# Patient Record
Sex: Female | Born: 1950 | Race: White | Hispanic: No | Marital: Married | Smoking: Former smoker
Health system: Southern US, Community
[De-identification: ages and names within clinical notes are randomized; demographics above are authoritative.]

## PROBLEM LIST (undated history)

## (undated) DIAGNOSIS — L439 Lichen planus, unspecified: Secondary | ICD-10-CM

## (undated) DIAGNOSIS — I10 Essential (primary) hypertension: Secondary | ICD-10-CM

## (undated) DIAGNOSIS — M1990 Unspecified osteoarthritis, unspecified site: Secondary | ICD-10-CM

## (undated) DIAGNOSIS — I480 Paroxysmal atrial fibrillation: Secondary | ICD-10-CM

## (undated) DIAGNOSIS — I491 Atrial premature depolarization: Secondary | ICD-10-CM

## (undated) DIAGNOSIS — R55 Syncope and collapse: Secondary | ICD-10-CM

## (undated) DIAGNOSIS — D249 Benign neoplasm of unspecified breast: Secondary | ICD-10-CM

## (undated) HISTORY — DX: Lichen planus, unspecified: L43.9

## (undated) HISTORY — PX: ABDOMINAL HYSTERECTOMY: SHX81

## (undated) HISTORY — DX: Unspecified osteoarthritis, unspecified site: M19.90

## (undated) HISTORY — PX: BREAST SURGERY: SHX581

## (undated) HISTORY — PX: REPLACEMENT TOTAL KNEE: SUR1224

## (undated) HISTORY — DX: Atrial premature depolarization: I49.1

## (undated) HISTORY — DX: Paroxysmal atrial fibrillation: I48.0

## (undated) HISTORY — DX: Essential (primary) hypertension: I10

## (undated) HISTORY — DX: Syncope and collapse: R55

## (undated) HISTORY — PX: CHOLECYSTECTOMY: SHX55

## (undated) HISTORY — PX: FOOT FUSION: SHX956

---

## 1998-04-11 ENCOUNTER — Ambulatory Visit (HOSPITAL_COMMUNITY): Admission: RE | Admit: 1998-04-11 | Discharge: 1998-04-11 | Payer: Self-pay | Admitting: Orthopedic Surgery

## 1998-04-11 ENCOUNTER — Encounter: Payer: Self-pay | Admitting: Orthopedic Surgery

## 1998-11-25 ENCOUNTER — Encounter: Payer: Self-pay | Admitting: Cardiology

## 1998-11-25 ENCOUNTER — Inpatient Hospital Stay (HOSPITAL_COMMUNITY): Admission: EM | Admit: 1998-11-25 | Discharge: 1998-11-25 | Payer: Self-pay | Admitting: Emergency Medicine

## 1998-12-12 ENCOUNTER — Ambulatory Visit (HOSPITAL_BASED_OUTPATIENT_CLINIC_OR_DEPARTMENT_OTHER): Admission: RE | Admit: 1998-12-12 | Discharge: 1998-12-12 | Payer: Self-pay | Admitting: Orthopedic Surgery

## 2000-12-02 ENCOUNTER — Emergency Department (HOSPITAL_COMMUNITY): Admission: EM | Admit: 2000-12-02 | Discharge: 2000-12-02 | Payer: Self-pay | Admitting: Internal Medicine

## 2001-04-14 ENCOUNTER — Other Ambulatory Visit: Admission: RE | Admit: 2001-04-14 | Discharge: 2001-04-14 | Payer: Self-pay | Admitting: Gynecology

## 2003-12-17 ENCOUNTER — Other Ambulatory Visit: Admission: RE | Admit: 2003-12-17 | Discharge: 2003-12-17 | Payer: Self-pay | Admitting: Gynecology

## 2005-06-15 ENCOUNTER — Inpatient Hospital Stay (HOSPITAL_COMMUNITY): Admission: RE | Admit: 2005-06-15 | Discharge: 2005-06-19 | Payer: Self-pay | Admitting: Orthopedic Surgery

## 2007-01-04 ENCOUNTER — Ambulatory Visit: Payer: Self-pay | Admitting: Cardiology

## 2007-01-04 ENCOUNTER — Inpatient Hospital Stay (HOSPITAL_COMMUNITY): Admission: AD | Admit: 2007-01-04 | Discharge: 2007-01-05 | Payer: Self-pay | Admitting: Cardiology

## 2007-01-05 ENCOUNTER — Ambulatory Visit: Payer: Self-pay | Admitting: Cardiology

## 2007-01-07 ENCOUNTER — Ambulatory Visit: Payer: Self-pay | Admitting: Internal Medicine

## 2007-01-07 LAB — CONVERTED CEMR LAB
ALT: 167 units/L — ABNORMAL HIGH (ref 0–40)
Albumin: 3.5 g/dL (ref 3.5–5.2)
Alkaline Phosphatase: 143 units/L — ABNORMAL HIGH (ref 39–117)
Bilirubin, Direct: 0.1 mg/dL (ref 0.0–0.3)
Total Bilirubin: 0.6 mg/dL (ref 0.3–1.2)
Total Protein: 6.5 g/dL (ref 6.0–8.3)

## 2007-01-12 ENCOUNTER — Ambulatory Visit: Payer: Self-pay | Admitting: Internal Medicine

## 2007-02-01 ENCOUNTER — Ambulatory Visit: Payer: Self-pay | Admitting: Internal Medicine

## 2007-02-01 LAB — CONVERTED CEMR LAB
ALT: 15 U/L
AST: 19 U/L
Albumin: 3.5 g/dL
Alkaline Phosphatase: 65 U/L
Bilirubin, Direct: 0.1 mg/dL
Total Bilirubin: 0.7 mg/dL
Total Protein: 6.3 g/dL

## 2008-01-25 ENCOUNTER — Encounter: Admission: RE | Admit: 2008-01-25 | Discharge: 2008-01-25 | Payer: Self-pay | Admitting: Orthopedic Surgery

## 2008-05-21 ENCOUNTER — Ambulatory Visit (HOSPITAL_COMMUNITY): Admission: RE | Admit: 2008-05-21 | Discharge: 2008-05-21 | Payer: Self-pay | Admitting: Orthopedic Surgery

## 2008-06-25 ENCOUNTER — Inpatient Hospital Stay (HOSPITAL_COMMUNITY): Admission: RE | Admit: 2008-06-25 | Discharge: 2008-06-27 | Payer: Self-pay | Admitting: Orthopedic Surgery

## 2008-06-25 ENCOUNTER — Encounter (INDEPENDENT_AMBULATORY_CARE_PROVIDER_SITE_OTHER): Payer: Self-pay | Admitting: Orthopedic Surgery

## 2009-04-03 ENCOUNTER — Encounter: Admission: RE | Admit: 2009-04-03 | Discharge: 2009-04-03 | Payer: Self-pay | Admitting: Orthopedic Surgery

## 2010-07-01 ENCOUNTER — Encounter: Admission: RE | Admit: 2010-07-01 | Discharge: 2010-07-01 | Payer: Self-pay | Admitting: Orthopedic Surgery

## 2010-11-05 ENCOUNTER — Other Ambulatory Visit: Payer: Self-pay | Admitting: Dermatology

## 2010-12-16 NOTE — Consult Note (Signed)
Denise Ayala, SUSI NO.:  [id number]   MEDICAL RECORD NO.:  [id number]          PATIENT TYPE:  INP   LOCATION:  4737                         FACILITY:  [facility]   PHYSICIAN:  Iva Boop, MD,FACGDATE OF BIRTH:  1950/08/08   DATE OF CONSULTATION:  DATE OF DISCHARGE:                                 CONSULTATION   REASON FOR CONSULTATION:  Abnormal LFTs.   ASSESSMENT:  A 60 year old white woman who developed diarrhea and  diffuse aches and pains of abdominal pain prior to admission.  The  diarrhea resolved but she still fell unwell and had some chest pain  issues.  She has ruled out for MI.  Stress test is negative.  She has  elevated transaminases and alkaline phosphatase with AST 543-261, ALT  334-349, alk phos 164-172 and a total bilirubin normal.  She feels  better at this point, though she still is sort of sore all over.  Her  CPKs were normal.   I think she had some sort of viral gastroenteritis or other type of  infectious problem that seems to be resolving.  Serious cardiac problems  have been ruled out.  Gallbladder ultrasound (she is status post chole)  is unrevealing.   PLAN AND RECOMMENDATIONS:  Discharge to home.  I discussed this with Dr.  Riley Kill.  We plan to follow up LFTs in outpatient, and I will arrange  that.  If they fail to improve, then we will investigate further, but I  am expecting that this will resolve over time.   HISTORY:  A 60 year old with problems as outlined above.  She does have  a prior history of abnormal LFTs with Daypro.  She takes Celebrex,  Toprol XL, aspirin, Elavil and Premarin chronically, but has not had any  abnormal liver tests lately that she is aware of.  She did have some  beers over the weekend prior to the starting, then felt sort of achy and  unwell, a little bit nauseous and had a lot of diarrhea with no  bleeding.  She did not describe fevers that I am aware of.  She had some  epigastric burning and  chest pain.  Saw Dr. Riley Kill.  Because of that,  she was admitted to the hospital and underwent stress testing which was  negative.  EKG negative.  Rule out for MI.   PAST MEDICAL HISTORY:  1. Leg cancer.  2. Atrial fibrillation.  3. Transient hypertension.  4. Right knee surgery.  5. Right breast biopsy.  6. Cholecystectomy 1972 (these symptoms are not like that).   DRUG ALLERGIES:  CODEINE, VICODIN/HYDROCORTISONE, OXYCODONE   REVIEW OF SYSTEMS:  She had some knee pain and had a cortisone shot 2  weeks ago.  She had an ocular virus lately or at least a presumptive  diagnosis of blurry vision that is almost completely cleared.   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  Rare  alcohol, overall former smoker.   FAMILY HISTORY:  Positive for coronary artery disease, emphysema,  multiple sclerosis and COPD.   REVIEW  OF SYSTEMS:  Knee pain.  No chills.  No fever.  Contact lenses  are used.  Blurry vision has been resolved.  There has been no weight  loss.  All other systems appear negative except for those things  described above.  She did have some Cipro for urinary tract infection  recently.  She never took Cipro and Flagyl as prescribed for the  diarrheal illness, it resolved on its.   PHYSICAL EXAMINATION:  GENERAL:  Well-developed, well-nourished woman in  no acute distress.  VITAL SIGNS:  Temperature 99.7, pulse 64, respirations 18, blood  pressure 102/63.  HEENT:  EYES:  Anicteric.  Mouth free of lesions.  NECK:  Supple.  No thyromegaly or masses.  CHEST:  Clear.  HEART:  S1, S2.  No murmurs or gallops.  ABDOMEN:  Soft, nontender without organomegaly or masses.  It is mildly  tender diffusely, but not serious tenderness.  LOWER EXTREMITIES:  Free of edema.  There are surgical scars in the  lower extremities.  She has a hyperpigmented violaceous area in the  pretibial area on the right.  This is chronic, mildly tender, slightly  swollen there.    LABORATORY DATA:  Labs as described above.  B-met normal.  Coags normal.  CBC normal.  Hepatitis A IgM, antibody B surface antigen, core antibody  IgM and C virus antibody is negative.  EKG normal.  Stress test as  above.  PA and lateral chest x-ray normal.  Ultrasound of the abdomen  normal status post cholecystectomy.   I appreciate the opportunity to care for this patient.      Iva Boop, MD,FACG  Electronically Signed     CEG/MEDQ  D:  01/05/2007  T:  01/06/2007  Job:  [id number]   cc:   Arturo Morton. Riley Kill, MD, [facility]  Gloriajean Dell. Andrey Campanile, M.D.

## 2010-12-16 NOTE — Op Note (Signed)
Denise Ayala, Denise Ayala                 ACCOUNT NO.:  [id number]   MEDICAL RECORD NO.:  [id number]          PATIENT TYPE:  INP   LOCATION:  5019                         FACILITY:  [facility]   PHYSICIAN:  Mila Homer. Sherlean Foot, M.D. DATE OF BIRTH:  1951-01-12   DATE OF PROCEDURE:  06/25/2008  DATE OF DISCHARGE:                               OPERATIVE REPORT   SURGEON:  Mila Homer. Sherlean Foot, MD   ASSISTANT:  1. Altamese Cabal, PA-C  2. Skip Mayer, PA   PREOPERATIVE DIAGNOSIS:  Failed right total knee arthroplasty.   POSTOPERATIVE DIAGNOSIS:  Failed right total knee arthroplasty.   PROCEDURE:  Right total knee arthroplasty.   INDICATIONS FOR PROCEDURE:  The patient is a 60 year old white female  with failure of a primary knee replacement.  Preoperative range of  motion 0-45 degrees.  Informed consent obtained.   DESCRIPTION OF PROCEDURE:  The patient was laid supine and administered  general anesthesia.  Foley catheter was placed.  Preoperative range of  motion 0-45 under anesthesia.  I then sterilely prepped and draped the  extremity and exsanguinated with an Esmarch and inflated the tourniquet  to 350 mmHg.  I then made a midline incision.  I extended the prior  excision of at least 4 cm.  I used the new blade to make a median  parapatellar arthrotomy and performed synovectomy.  There was lots of  scar tissue in knee and I debrided all sharply.  Once I had cleaned out  the medial and lateral gutters, I was able to evaluate the patellar  component which looked actually quite good and subluxed the patella  laterally around the flexion at this point with the patella tracked and  I could get to 90 degrees and with a sublux, could get to about 105  degrees.  I then evaluated the components.  It did appear that the  anterior component was mildly loose, so that I could definitely see  fluid coming out when I would stress the knee in a varus valgus  direction from under the anterior wedge.  The small  sagittal saw was  used to create an interface between the component and the cement.  I  then removed the femoral and tibial components quite easily.  I then  removed all excess cement and soft tissue.  I removed scar tissue from  the back of the knee.  Posterior condylar osteophytes were removed as  well.  I then used intramedullary reamers with 14 on the femur, 11 on  the tibia.  I sized to size D on the femur and had to take a little bit  of bone anteriorly and posteriorly, with that could see the facet, cup  with the box and had excellent fit of the D component with a 14 offset  stem bringing the component a little bit more posterior.  I then put a  Hohmann retractor in place, hyperflexed the knee at this point to get to  about 105 degrees and used a 3 baseplate drilled and keeled and put a 10-  mm stem on and then trialed 3  tibia, 10 stem, 14 offset stem on the  femur with a size D.  No augments, various polyethylenes and felt a 14  gave [place] best flexion/extension gap balance after releasing a little  medially to get balance.  Patella tracked beautifully.  At this point, I  removed the trial components and copiously irrigated.  I then cemented  in the components with Palamed G cement and removed all excess cement  and allowed the cement to hard in extension.  I then let tourniquet  down, obtained hemostasis, and copiously irrigated once again.  A left  Hemovac coming out superolaterally and deep to the arthrotomy. Pain  catheter coming out superomedially and superficially to the arthrotomy.  I then closed the arthrotomy with figure-of-eight #1 Vicryl sutures,  deep soft tissue with buried with 0 Vicryl sutures, subcuticular 2-0  Vicryl stitch and skin staples.  Dressed with Xeroform dressing,  sponges, sterile Webril, and TED stocking.   COMPLICATIONS:  None.   DRAINS:  One Hemovac and one pain catheter.   TOURNIQUET TIME:  1 hour 34 minutes.            ______________________________  Mila Homer Sherlean Foot, M.D.     SDL/MEDQ  D:  06/25/2008  T:  06/26/2008  Job:  [id number]

## 2010-12-16 NOTE — H&P (Signed)
[name], [name]                 ACCOUNT NO.:  [id number]   MEDICAL RECORD NO.:  [id number]          PATIENT TYPE:  INP   LOCATION:  4737                         FACILITY:  [facility]   PHYSICIAN:  Arturo Morton. Riley Kill, MD, FACCDATE OF BIRTH:  Mar 09, 1951   DATE OF ADMISSION:  01/04/2007  DATE OF DISCHARGE:                              HISTORY & PHYSICAL   CHIEF COMPLAINT:  Chest pain since Sunday night.   HISTORY OF PRESENT ILLNESS:  [name] is a 60 year old female who  I have seen previously.  She had chest pain in 1998 and underwent  cardiac catheterization by Dr. Syliva Overman demonstrating systemic  hypertension, but no epicardial coronary artery disease.  Overall LV  function was preserved.  She underwent stress Cardiolite imaging.  This  was done in 2004.  At that time, there was a hypertensive response to  exercise but no electrocardiographic normal myocardial perfusion image  abnormalities associated with ischemia.  She has subsequently done well.  On Friday night, this woman had five beers.  Over the weekend, she has  had diarrhea.  She has been placed on Cipro for this.  The diarrhea has  improved.  She had liver function studies done yesterday in the office,  associated with other laboratory studies.  The liver function studies  demonstrated marked abnormalities.  While she was in the office, she had  EKG which showed nonspecific ST-T abnormalities and she was set up to be  seen today.  Two nights ago, the patient had an episode of substernal  chest pain.  It did not radiate much but then she had an episode of  substernal chest pain radiating into the jaw today.  She has had  recurrent episodes of chest pain that gradually resolved, both awoke her  from sleep.   CURRENT ALLERGIES:  CODEINE.   MEDICATIONS:  1. Toprol XL 100 mg daily.  2. Celebrex 200 mg daily.  3. Premarin 0.65 mg daily.  4. Aspirin 81 mg daily.  5. Elavil 50 mg daily.   PAST MEDICAL HISTORY:  1. Hypertension.  2. She had a leg cancer.  3. She apparently has had atrial fibrillation, although I was unaware      of this.  4. She had a right knee surgeries.  5. Right breast biopsy 10 years ago.  6. She had a cholecystectomy in 1972.   Her last chest x-ray was in November 2006 and revealed no active  cardiopulmonary disease.   FAMILY HISTORY:  Remarkable for mother who died at 53 of an MI.  Father  died at 81 and had emphysema.  Maternal grandmother had MS.  She has  three sisters, one with COPD, one with type 2 diabetes and one who is  healthy.  She has no brothers.   REVIEW OF SYSTEMS:  She has had some decreased vision.  She wears  glasses.  She wears partial dentures.  She has had some nausea and  diarrhea over the past few days.  She has had no known bleeding.  She  had a UTI treated year ago.  Her last Pap was in October 2007 and a  mammogram in August 2007.  She had some cramping of the legs last week.   The patient is an Print production planner for a [employer], product/process development.  She is  married and she likes to bike.  She has two children.   PHYSICAL EXAMINATION:  GENERAL APPEARANCE:  She is alert and oriented in  no acute distress.  VITAL SIGNS:  The blood pressure is 118/68 in the right arm and 122/74  in the left arm.  The pulse was 63.  NECK:  Jugular veins are not distended.  CARDIAC:  Rhythm is regular.  There is no murmur.  ABDOMEN:  Soft.  There is no hepatosplenomegaly noted.  EXTREMITIES:  No edema.  NEUROLOGIC:  Exam is nonfocal.   Electrocardiogram demonstrates normal sinus rhythm within normal limits.   IMPRESSION:  1. Recurrent episodes of substernal chest pain of uncertain etiology.  2. Recent diarrhea, now on Cipro.  3. Marked abnormality of liver function maladies.  4. History of cholecystectomy.  5. History of hypertension.  6. Questionable history of atrial fibrillation.   PLAN:  1. Admit to the hospital.  2. Serial enzymes.  3. Exercise tolerance  test in the morning.  4. GI consult.  5. Recheck liver function abnormalities.      Arturo Morton. Riley Kill, MD, [facility]  Electronically Signed     TDS/MEDQ  D:  01/04/2007  T:  01/04/2007  Job:  [id number]   cc:   Gloriajean Dell. Andrey Campanile, M.D.  Evelena Peat, M.D.

## 2010-12-19 NOTE — Op Note (Signed)
NAMEBONNEE, Denise Ayala NO.:  [id number]   MEDICAL RECORD NO.:  [id number]          PATIENT TYPE:  INP   LOCATION:  5003                         FACILITY:  [facility]   PHYSICIAN:  Loreta Ave, M.D. DATE OF BIRTH:  1950/10/01   DATE OF PROCEDURE:  06/15/2005  DATE OF DISCHARGE:                                 OPERATIVE REPORT   PREOPERATIVE DIAGNOSIS:  Right knee end stage degenerative arthritis,  patellofemoral joint with lesser extent changes lateral compartment.   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  Mild  flexion contracture.   OPERATION PERFORMED:  Right knee total knee replacement, [employer]  prosthesis.  Minimally invasive system.  Cemented posterior stabilized #7  femoral component.  Cemented #5 tibial component with an 8 mm posterior  stabilized polyethylene Flex insert.  Cemented resurfacing medialized #5  patellar component.  Soft tissue balancing including lateral retinacular  release.   SURGEON:  Loreta Ave, M.D.   ASSISTANT:  Genene Churn. Denton Meek.   ANESTHESIA:  General.   ESTIMATED BLOOD LOSS:  Minimal.   TOURNIQUET TIME:  Two hours.   SPECIMENS:  None.   CULTURES:  None.   COMPLICATIONS:  None.   DRESSING:  Soft compressive with knee immobilizer.   DRAINS:  Hemovac times one.   DESCRIPTION OF PROCEDURE:  The patient was brought to the operating room and  after adequate anesthesia had been obtained, the right knee examined.  5  degree flexion contracture, still lateral tracking, little tethering  patellofemoral joint.  Further flexion better than 110 degrees.  Stable  ligaments.  Tourniquet applied.  Leg prepped and draped in the usual sterile  fashion.  Exsanguinated elevation Esmarch. Tourniquet inflated 350 mmHg.  Lontitudinal incision above the patella down to the tibial tubercle.  Medial  arthrotomy with a  minimally invasive approach splitting the vastus and  sparing the quad tendon.  Knee exposed.  Grade 4 changes throughout the  patellofemoral joint as expected.  The medial compartment looked good but  unfortunately the lateral compartment had advanced considerably from her  previous arthroscopy.  Therefore, my initial effort just to replace the  patellofemoral joint was abandoned and I went with a total knee approach  because of the grade 3 and 4 changes laterally.  Sufficient capsular release  to expose the knee.  Periarticular spurs, remnants of menisci, cruciate  ligaments removed.  Attention turned to tibia.  Extramedullary guide 0  cutting device.  6 mm cut off the deficient medial side, protecting  posterior collateral structures.  Very good bone stock. Tibia sized with a  #5 component. Femur exposed.  Intramedullary guide placed.  Distal cut,  appropriate external rotation resecting 10 mm, set at about 5 degrees of  valgus to match her mechanical axis.  Size for a #7 component.  Definitive  cuts made with appropriate jigs for the peg, posterior stabilized #7 femoral  component.  Patella was everted.  Marked erosion whole lateral side so I  felt resurfacing was  better than a recessed patella. Resection back to a 10  mm thickness of the patella throughout.  I sized and then drilled for the  resurfacing #5 component with the medial offset.  When that was completed,  all recesses examined and copious irrigation with the pulse irrigating  device.  Trials put in place.  A #7 on the femur, a #5 on the tibia and #5  on the patella.  With the X3 poly being utilized, I elected to go with an 8  mm insert.  With the 8 mm  trial on tibia and the other inserts in place, I  had full extension, nicely balanced knee, full flexion.  Although I had good  external rotation of the femur, I still had a tendency for the patella to  want to tether out laterally.  Lateral release from the inside with  cautery  improved this so I had good patellofemoral tracking and nice balance of  patellofemoral joint throughout anterolateral release. Tibia was marked for  appropriate rotation and then hand reamed.  All trials removed. Copious  irrigation.  All recesses examined to be sure all spurs and loose bodies  removed.  Cement prepared and placed on components.  Tibial component  hammered into place. Polyethylene attached and the femoral component seated  and firmly cemented in place.  Patellar component also seated and cemented  as well.  All excessive cement removed.  At completion, once the cement  hardened, the knee was re-examined.  Full extension, full flexion, good  stability, nicely balanced knee with good stability in flexion and  extension.  Excellent patellofemoral tracking and stability.  Wound  irrigated.  Hemovac placed and brought out through a separate stab wound.  Arthrotomy closed with #1 Vicryl, subcutaneous and subcuticular tissue with  2-0 Vicryl and then staples.  Knee injected with Marcaine and Hemovac  clamped.  Sterile compressive dressing applied.  Tourniquet inflated and  removed.  Knee immobilizer applied.  Anesthesia reversed.  Brought to  recovery room.  Tolerated surgery well.  No complications.      Loreta Ave, M.D.  Electronically Signed     DFM/MEDQ  D:  06/16/2005  T:  06/17/2005  Job:  [id number]

## 2010-12-19 NOTE — Discharge Summary (Signed)
Denise Ayala, Denise Ayala NO.:  [id number]   MEDICAL RECORD NO.:  [id number]          PATIENT TYPE:  INP   LOCATION:  5003                         FACILITY:  [facility]   PHYSICIAN:  Loreta Ave, M.D. DATE OF BIRTH:  1951/05/28   DATE OF ADMISSION:  06/15/2005  DATE OF DISCHARGE:  06/19/2005                                 DISCHARGE SUMMARY   FINAL DIAGNOSES:  1.  Status post right total knee replacement for end-stage degenerative      joint disease.  2.  Hypertension, NOS  3.  Long term use of anticoagulants.   HISTORY OF PRESENT ILLNESS:  60 year old white female with history of end-  stage degenerative joint disease right knee who presented to our office for  preoperative evaluation for total knee replacement procedure.  She had  progressively worsening pain which failed to respond to conservative  treatment and had significant decrease in her daily activities due to the  ongoing complaint.   HOSPITAL COURSE:  On June 15, 2005 the patient was taken to the [facility] operating room and a right total knee replacement procedure was  performed.  Surgeon was Dr. Loreta Ave and assisted by Fayrene Fearing  __________  PA-C.  Anesthesia was general with femoral nerve block.  EBL was  minimal.  Tourniquet time was two hours.  Hemovac drain X1 placed.  No  specimens.  There were no surgical or anesthesia complications and she was  transferred to the recovery room in stable condition.  On June 16, 2005  the patient was doing well with some complaints of knee pain.  Vital signs  were stable and she was afebrile.  Hemoglobin was 9.5, glucose 137, INR was  1.1.  Dressing was clean, dry and intact.  The calf was nontender,  neurovascularly intact distally.  Physical therapy, occupational therapy  consults.  Started pharmacy protocol Coumadin.  On June 17, 2005 the  patient was doing well with good pain control.  No chest pain, shortness of  breath.  Patient  ambulated to door in the room.  Vital signs were stable and  she was afebrile. Hemoglobin was 9.2, potassium 3.8, sodium 137, INR 1.7,  glucose 117.  Wound looked good.  Staples were intact.  There were no signs  of infection.  Hemovac drain was pulled.  Calf was nontender and  neurovascularly intact.  Discontinued the morphine PCA, Foley catheter and  O2, Hep-Lock IV.  On June 18, 2005 the patient was doing well with good  pain control.  Temperature 100.1, pulse 98, respirations 20, blood pressure  111/70.  Hemoglobin was 9.2, INR 2.2, electrolytes were stable. Wound looks  good, staples intact. No signs of infection.  Calf nontender and  neurovascularly intact.  Discontinued Hep-Lock. On June 19, 2005, the  patient is doing very well.  Good pain and nausea control.  Good hall  ambulation.  Vital signs stable and afebrile. INR is 2.2.  The wound looked  good and staples intact.  No signs of infection.  Calf nontender.  The  patient later completed stair training  and was ready for discharge home.   DISPOSITION:  Patient discharged home.   MEDICATIONS:  1.  Darvocet-N 100 one to two tablets p.o. q.4-6h. PRN for pain.  2.  Robaxin 500 mg one tablet p.o. q.6h. PRN for spasms.  3.  Coumadin per pharmacy protocol X4 weeks postoperatively.  4.  Resume previous home medications.   CONDITION ON DISCHARGE:  Good and stable.   DISCHARGE INSTRUCTIONS:  Patient will work with home health physical therapy  and occupational therapy to improve ambulation, range of motion,  strengthening.  Coumadin X4 weeks postoperatively for deep venous thrombosis  prophylaxis.  Staples are to be removed two weeks postoperatively.  Dressing  changes daily.  She will follow up in our office two weeks postoperatively  for recheck or return sooner if needed.      Loreta Ave, M.D.  Electronically Signed     DFM/MEDQ  D:  08/26/2005  T:  08/26/2005  Job:  [id number]

## 2010-12-19 NOTE — Discharge Summary (Signed)
Denise Ayala, Denise Ayala                 ACCOUNT NO.:  [id number]   MEDICAL RECORD NO.:  [id number]          PATIENT TYPE:  INP   LOCATION:  5019                         FACILITY:  [facility]   PHYSICIAN:  Mila Homer. Sherlean Foot, M.D. 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   DISCHARGE DIAGNOSES:  Painful right knee after total knee arthroplasty,  right total knee revision status post, and acute blood loss anemia,  status post surgery.   PROCEDURE:  Right total knee revision.   HISTORY:  This is a 60 year old patient who had a stiff painful knee  after a right TKA last year.  Infection was ruled out with aspiration  and successful loosening of hardware.  Risks and benefits of revision  were discussed with the patient.  The patient would like to proceed with  surgery, was originally scheduled in October 2009, was postponed twice,  once for open wound and once again by the patient for back pain.   ALLERGIES:  The patient is allergic to CODEINE and ADHESIVE TAPE.   ADMISSION MEDICATIONS:  Upon admission, the patient was taking,  1. Mobic 15 mg daily.  2. Premarin 0.0625 mg daily.  3. Toprol-XL 100 mg daily.  4. Aspirin 81 mg daily.   HOSPITAL COURSE:  This is a 60 year old woman who was admitted on  June 25, 2008, after appropriate laboratory studies were obtained  preoperatively as well as Ancef on call to the operating room.  She was  taken to the OR where she underwent a right total knee revision.  She  tolerated the procedure well and was taken to the PACU in good  condition.  The patient was placed on a Dilaudid PCA pump, full-dose  protocol.  Foley was placed intraoperatively.   Postop day #1, vital signs were stable.  The patient denied chest pain,  shortness of breath, or calf pain.  The patient was started on  Lovenox  30 mg q.12 h. at a.m.  Consults to PT, OT, and care management were  made.  The patient was weightbearing as tolerated.  CPM 0-90 degrees, 6-  8 hours per day, and incentive spirometry teaching was done.   Postop day #2, the patient continued to progress with physical therapy.  Dressing was changed.  Marcaine pump and Hemovac were discontinued and  Foley was discontinued.  PCA was discontinued and IV was hep-locked.  The patient was continued on p.o. meds for pain.  The patient was  discharged after Lovenox teaching.   LABORATORY STUDIES:  Upon admission, the patient's white blood cell  count was 7.2, H and H 12.9/37.6, and platelets of 208.  Sodium was 138,  potassium 4.3, chloride was 103, CO2 was 29, glucose was 86, BUN was 9,  and creatinine was 0.66.  Upon discharge, white blood cell count was  8.8, H and H 9.4/26.6, and platelets were 166.  Sodium was 131,  potassium was 3.8, chloride was  97, CO2 was 30, glucose was 99, BUN was  5, and creatinine was 0.68.   DISCHARGE INSTRUCTIONS:  No restrictions on diet.  Follow the blue  instruction sheet for wound care.  Increase activity slowly.  May use a  cane or walker.  Weightbearing as tolerated.  No lifting or driving for  6 weeks.  Home health was secured.   DISCHARGE MEDICATIONS:  Upon discharge the patient was given  prescription for:  1. Robaxin 500 mg one to two tablets every 6-8 hours as needed for      spasm.  2. Lovenox 40 mg, inject one daily, last dose on July 09, 2008.  3. Zofran 4 mg ODT, place one tablet on tongue every 8 hours as needed      for nausea.  4. Dilaudid 2 mg one to two tablets every 4-6 hours as needed for      pain.   The patient will follow up with Dr. Sherlean Foot on July 10, 2008.  She is  to call for appointment at [phone].  Discharged in improved condition.     ______________________________  Altamese Cabal, PA-C    ______________________________  Mila Homer. Sherlean Foot, M.D.     MJ/MEDQ  D:  08/10/2008  T:  08/11/2008  Job:  [id number]

## 2011-01-19 ENCOUNTER — Other Ambulatory Visit: Payer: Self-pay | Admitting: Dermatology

## 2011-05-04 LAB — URINALYSIS, ROUTINE W REFLEX MICROSCOPIC
Bilirubin Urine: NEGATIVE
Glucose, UA: NEGATIVE
Ketones, ur: NEGATIVE
Protein, ur: NEGATIVE
Specific Gravity, Urine: 1.008

## 2011-05-04 LAB — TYPE AND SCREEN: ABO/RH(D): O NEG

## 2011-05-04 LAB — COMPREHENSIVE METABOLIC PANEL
ALT: 22
AST: 24
Albumin: 4
Alkaline Phosphatase: 77
Calcium: 9
Creatinine, Ser: 0.69
GFR calc Af Amer: >60
Glucose, Bld: 77
Total Bilirubin: 0.5

## 2011-05-04 LAB — DIFFERENTIAL
Basophils Relative: 1
Lymphocytes Relative: 26
Lymphs Abs: 1.6
Neutrophils Relative %: 61

## 2011-05-04 LAB — CBC
HCT: 37.4
MCV: 95.1
Platelets: 221
RDW: 12.6

## 2011-05-04 LAB — PROTIME-INR: Prothrombin Time: 12.8

## 2011-05-04 LAB — ABO/RH: ABO/RH(D): O NEG

## 2011-05-05 LAB — ANAEROBIC CULTURE

## 2011-05-05 LAB — COMPREHENSIVE METABOLIC PANEL
ALT: 22
Albumin: 3.9
Alkaline Phosphatase: 81
BUN: 9
CO2: 29
Chloride: 103
Creatinine, Ser: 0.66
GFR calc Af Amer: >60
Glucose, Bld: 86
Sodium: 138
Total Protein: 6

## 2011-05-05 LAB — APTT: aPTT: 27

## 2011-05-05 LAB — CROSSMATCH

## 2011-05-05 LAB — GRAM STAIN

## 2011-05-05 LAB — DIFFERENTIAL
Basophils Absolute: 0
Basophils Relative: 1
Lymphs Abs: 1.6
Monocytes Absolute: 0.6
Neutro Abs: 4.8
Neutrophils Relative %: 67

## 2011-05-05 LAB — CBC
HCT: 26.6 — ABNORMAL LOW
Hemoglobin: 9.4 — ABNORMAL LOW
MCHC: 34.4
MCHC: 35.2
MCV: 95
MCV: 95.2
MCV: 96.2
Platelets: 208
Platelets: 212
RBC: 2.77 — ABNORMAL LOW
RBC: 3.11 — ABNORMAL LOW
RBC: 3.95
RDW: 12.2
WBC: 7.2
WBC: 8.7
WBC: 8.8

## 2011-05-05 LAB — URINALYSIS, ROUTINE W REFLEX MICROSCOPIC
Nitrite: NEGATIVE
Specific Gravity, Urine: 1.005
pH: 6.5

## 2011-05-05 LAB — BASIC METABOLIC PANEL
BUN: 6
CO2: 30
Chloride: 102
Chloride: 97
Creatinine, Ser: 0.66
Creatinine, Ser: 0.68
GFR calc Af Amer: >60
GFR calc Af Amer: >60
GFR calc non Af Amer: >60
Potassium: 3.8
Potassium: 4.3
Sodium: 131 — ABNORMAL LOW

## 2011-05-05 LAB — URINE CULTURE: Colony Count: NO GROWTH

## 2011-05-05 LAB — WOUND CULTURE

## 2011-05-21 LAB — CARDIAC PANEL(CRET KIN+CKTOT+MB+TROPI)
CK, MB: 1
CK, MB: 1.9
Total CK: 81

## 2011-05-21 LAB — CBC
HCT: 35.5 — ABNORMAL LOW
Hemoglobin: 12.4
MCV: 93.3
Platelets: 201
RBC: 3.8 — ABNORMAL LOW
WBC: 6.8

## 2011-05-21 LAB — LIPID PANEL
HDL: 80
LDL Cholesterol: 149 — ABNORMAL HIGH
Triglycerides: 38
VLDL: 8

## 2011-05-21 LAB — COMPREHENSIVE METABOLIC PANEL
Albumin: 3.5
Alkaline Phosphatase: 172 — ABNORMAL HIGH
BUN: 7
CO2: 30
Chloride: 100
Creatinine, Ser: 0.6
GFR calc non Af Amer: >60
Potassium: 4.1
Total Bilirubin: 0.8

## 2011-05-21 LAB — PROTIME-INR: INR: 1

## 2011-05-21 LAB — APTT: aPTT: 29

## 2011-09-22 ENCOUNTER — Other Ambulatory Visit: Payer: Self-pay | Admitting: Orthopedic Surgery

## 2011-09-22 DIAGNOSIS — M25512 Pain in left shoulder: Secondary | ICD-10-CM

## 2011-09-25 ENCOUNTER — Ambulatory Visit
Admission: RE | Admit: 2011-09-25 | Discharge: 2011-09-25 | Disposition: A | Discharge status: Home / Self Care | Payer: BC Managed Care – PPO | Source: Ambulatory Visit | Attending: Orthopedic Surgery | Admitting: Orthopedic Surgery

## 2011-09-25 DIAGNOSIS — M25512 Pain in left shoulder: Secondary | ICD-10-CM

## 2011-09-26 ENCOUNTER — Other Ambulatory Visit: Payer: Self-pay

## 2013-03-27 ENCOUNTER — Other Ambulatory Visit: Payer: Self-pay | Admitting: Gynecology

## 2014-08-03 DIAGNOSIS — D249 Benign neoplasm of unspecified breast: Secondary | ICD-10-CM

## 2014-08-03 HISTORY — DX: Benign neoplasm of unspecified breast: D24.9

## 2015-01-02 ENCOUNTER — Other Ambulatory Visit: Payer: Self-pay | Admitting: Obstetrics & Gynecology

## 2015-01-02 DIAGNOSIS — N6452 Nipple discharge: Secondary | ICD-10-CM

## 2015-01-09 ENCOUNTER — Ambulatory Visit
Admission: RE | Admit: 2015-01-09 | Discharge: 2015-01-09 | Disposition: A | Discharge status: Home / Self Care | Payer: BLUE CROSS/BLUE SHIELD | Source: Ambulatory Visit | Attending: Obstetrics & Gynecology | Admitting: Obstetrics & Gynecology

## 2015-01-09 ENCOUNTER — Other Ambulatory Visit: Payer: Self-pay | Admitting: Obstetrics & Gynecology

## 2015-01-09 DIAGNOSIS — N6452 Nipple discharge: Secondary | ICD-10-CM

## 2015-05-31 ENCOUNTER — Other Ambulatory Visit: Payer: Self-pay | Admitting: General Surgery

## 2015-06-17 ENCOUNTER — Encounter (HOSPITAL_BASED_OUTPATIENT_CLINIC_OR_DEPARTMENT_OTHER): Payer: Self-pay | Admitting: *Deleted

## 2015-06-17 NOTE — Progress Notes (Signed)
[place] at Dr Marlowe Aschoff called med back and stated that Dr Barry Dienes had just spoken to Dr Smith Robert in anesthesia and he said to bring patient in for labs and EKG and go from there.

## 2015-06-17 NOTE — Progress Notes (Signed)
[facility] reviewed with Dr Al Corpus. He requests that the pt see Cardiology prior to Surgery since last seen in 2008 with questionable diagnosis of AFib. Office notified and pt called.

## 2015-06-18 ENCOUNTER — Other Ambulatory Visit: Payer: Self-pay

## 2015-06-18 ENCOUNTER — Encounter (HOSPITAL_BASED_OUTPATIENT_CLINIC_OR_DEPARTMENT_OTHER)
Admission: RE | Admit: 2015-06-18 | Discharge: 2015-06-18 | Disposition: A | Discharge status: Home / Self Care | Payer: BLUE CROSS/BLUE SHIELD | Source: Ambulatory Visit | Attending: General Surgery | Admitting: General Surgery

## 2015-06-18 DIAGNOSIS — Z85828 Personal history of other malignant neoplasm of skin: Secondary | ICD-10-CM | POA: Diagnosis not present

## 2015-06-18 DIAGNOSIS — D242 Benign neoplasm of left breast: Secondary | ICD-10-CM | POA: Diagnosis present

## 2015-06-18 DIAGNOSIS — M1990 Unspecified osteoarthritis, unspecified site: Secondary | ICD-10-CM | POA: Diagnosis not present

## 2015-06-18 DIAGNOSIS — Z87891 Personal history of nicotine dependence: Secondary | ICD-10-CM | POA: Diagnosis not present

## 2015-06-18 LAB — BASIC METABOLIC PANEL
ANION GAP: 4 — AB (ref 5–15)
BUN: 10 mg/dL (ref 6–20)
CHLORIDE: 104 mmol/L (ref 101–111)
CO2: 30 mmol/L (ref 22–32)
Calcium: 9.1 mg/dL (ref 8.9–10.3)
Creatinine, Ser: 0.72 mg/dL (ref 0.44–1.00)
GFR calc non Af Amer: >60 mL/min (ref >60)
Glucose, Bld: 96 mg/dL (ref 65–99)
Potassium: 4.7 mmol/L (ref 3.5–5.1)
Sodium: 138 mmol/L (ref 135–145)

## 2015-06-20 NOTE — H&P (Signed)
Denise Ayala 05/31/2015 9:33 AM Location: [facility] Patient #: [id number] DOB: 09-09-1950 Married / Language: Cleophus Molt / Race: White Female   History of Present Illness [name] MD; 05/31/2015 10:06 AM) Patient words: bleeding from left nipple.  The patient is a 64 year old female who presents with a complaint of nipple discharge. Prior history [name] is a 64 year old female who is referred by Dr. Brigitte Pulse for consultation regarding an abnormal left nipple. She has had some redness and scaling of the nipple for several months. She also did have some nipple discharge that resolved. She underwent diagnostic mammography with a fine probably benign finding in the posterior lateral left breast. Scaling and erythema of the nipple did not have any mammographic correlate. She has no family history of cancers. She has had a personal history of a squamous cell cancer of the skin. She states the nipple has felt a little more irritated and slightly painful. She has had a excisional breast biopsy of the right breast in the past by Dr. Margot Chimes. She has also had multiple cysts aspirated. We did a punch biopsy of her nipple which demonstrated a nipple adenoma. ]  The patient has continued to have nonhealing of the nipple. She also continues to have itching and bleeding.   Allergies Elbert Ewings, CMA; 05/31/2015 9:34 AM) Codeine Sulfate *ANALGESICS - OPIOID* Antibacterial Lotion Soap *Antiseptics & Disinfectants  Medication History Elbert Ewings, CMA; 05/31/2015 9:34 AM) Toprol XL (100MG  Tablet ER 24HR, Oral) Active. Voltaren (25MG  Tablet DR, Oral) Active. Estradiol (1MG  Tablet, Oral) Active. Medications Reconciled    Review of Systems [name] MD; 05/31/2015 10:06 AM) All other systems negative  Vitals Elbert Ewings CMA; 05/31/2015 9:35 AM) 05/31/2015 9:34 AM Weight: 168 lb Height: 64in Body Surface Area: 1.82 m Body Mass Index: 28.84 kg/m  Temp.:  58F(Temporal)  Pulse: 77 (Regular)  BP: 126/68 (Sitting, Left Arm, Standard)       Physical Exam [name] MD; 05/31/2015 10:07 AM) General Mental Status-Alert. General Appearance-Consistent with stated age. Hydration-Well hydrated. Voice-Normal.  Breast Note: Left nipple does continue to have the punch biopsy incision opened. The bulk of the nipple laterally is much more erythematous than the other nipple. I suspect that this is all adenoma. That portion was also protuberant. The medial aspect of the nipple does appear to be more normal. Approximately 75% of the nipple appears abnormal.     Assessment & Plan [name] MD; 05/31/2015 10:08 AM) ADENOMA OF NIPPLE, LEFT (D24.2) Impression: Because the patient continues to have significant difficulty healing and problems with the left nipple, I would plan to excise it. There is an adenoma present, I suspect this is the reason for the nonhealing portion.  I discussed the surgery with the patient including the risks. I reviewed that I would leave the areola behind. I reviewed the risks of numbness, bleeding, infection, and other. She would like to do this as soon as possible. I discussed this as an outpatient surgery with approximately a 1 week window of her strictures. 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 Current Plans You are being scheduled for surgery - Our schedulers will call you.  You should hear from our office's scheduling department within 5 working days about the location, date, and time of surgery. We try to make accommodations for patient's preferences in scheduling surgery, but sometimes the OR schedule or the surgeon's schedule prevents [place] from making those accommodations.  If you have not heard from our office [phone]) in 5 working days, call the office and ask for your surgeon's nurse.  If you have other questions about your diagnosis, plan,  or surgery, call the office and ask for your surgeon's nurse.  Pt Education - [facility] Free Text Education/Instructions: discussed with patient and provided information.   Signed by [name], MD (05/31/2015 10:09 AM)

## 2015-06-24 ENCOUNTER — Ambulatory Visit (HOSPITAL_BASED_OUTPATIENT_CLINIC_OR_DEPARTMENT_OTHER): Payer: BLUE CROSS/BLUE SHIELD | Admitting: Anesthesiology

## 2015-06-24 ENCOUNTER — Encounter (HOSPITAL_BASED_OUTPATIENT_CLINIC_OR_DEPARTMENT_OTHER): Payer: Self-pay | Admitting: *Deleted

## 2015-06-24 ENCOUNTER — Ambulatory Visit (HOSPITAL_BASED_OUTPATIENT_CLINIC_OR_DEPARTMENT_OTHER)
Admission: RE | Admit: 2015-06-24 | Discharge: 2015-06-24 | Disposition: A | Discharge status: Home / Self Care | Payer: BLUE CROSS/BLUE SHIELD | Source: Ambulatory Visit | Attending: General Surgery | Admitting: General Surgery

## 2015-06-24 ENCOUNTER — Encounter (HOSPITAL_BASED_OUTPATIENT_CLINIC_OR_DEPARTMENT_OTHER)
Admission: RE | Disposition: A | Discharge status: Home / Self Care | Payer: Self-pay | Source: Ambulatory Visit | Attending: General Surgery

## 2015-06-24 DIAGNOSIS — M1990 Unspecified osteoarthritis, unspecified site: Secondary | ICD-10-CM | POA: Insufficient documentation

## 2015-06-24 DIAGNOSIS — Z85828 Personal history of other malignant neoplasm of skin: Secondary | ICD-10-CM | POA: Insufficient documentation

## 2015-06-24 DIAGNOSIS — D242 Benign neoplasm of left breast: Secondary | ICD-10-CM | POA: Diagnosis not present

## 2015-06-24 DIAGNOSIS — Z87891 Personal history of nicotine dependence: Secondary | ICD-10-CM | POA: Insufficient documentation

## 2015-06-24 HISTORY — DX: Benign neoplasm of unspecified breast: D24.9

## 2015-06-24 HISTORY — DX: Unspecified osteoarthritis, unspecified site: M19.90

## 2015-06-24 HISTORY — PX: BREAST BIOPSY: SHX20

## 2015-06-24 SURGERY — BREAST BIOPSY
Anesthesia: General | Site: Breast | Laterality: Left

## 2015-06-24 MED ORDER — PROMETHAZINE HCL 25 MG/ML IJ SOLN: 6.2500 mg | INTRAMUSCULAR | Status: DC | PRN

## 2015-06-24 MED ORDER — FENTANYL CITRATE (PF) 100 MCG/2ML IJ SOLN
INTRAMUSCULAR | Status: AC
  Filled 2015-06-24: qty 2

## 2015-06-24 MED ORDER — FENTANYL CITRATE (PF) 100 MCG/2ML IJ SOLN: 50.0000 ug | INTRAMUSCULAR | Status: DC | PRN

## 2015-06-24 MED ORDER — PROPOFOL 500 MG/50ML IV EMUL
INTRAVENOUS | Status: AC
  Filled 2015-06-24: qty 50

## 2015-06-24 MED ORDER — SUCCINYLCHOLINE CHLORIDE 20 MG/ML IJ SOLN
INTRAMUSCULAR | Status: AC
  Filled 2015-06-24: qty 1

## 2015-06-24 MED ORDER — CEFAZOLIN SODIUM-DEXTROSE 2-3 GM-% IV SOLR
2.0000 g | INTRAVENOUS | Status: AC
  Administered 2015-06-24: 2 g via INTRAVENOUS

## 2015-06-24 MED ORDER — MIDAZOLAM HCL 5 MG/5ML IJ SOLN
INTRAMUSCULAR | Status: DC | PRN
  Administered 2015-06-24: 2 mg via INTRAVENOUS

## 2015-06-24 MED ORDER — GLYCOPYRROLATE 0.2 MG/ML IJ SOLN
0.2000 mg | Freq: Once | INTRAMUSCULAR | Status: AC | PRN
  Administered 2015-06-24: 0.2 mg via INTRAVENOUS

## 2015-06-24 MED ORDER — ONDANSETRON HCL 4 MG/2ML IJ SOLN
INTRAMUSCULAR | Status: AC
  Filled 2015-06-24: qty 2

## 2015-06-24 MED ORDER — LIDOCAINE-EPINEPHRINE (PF) 1 %-1:200000 IJ SOLN
INTRAMUSCULAR | Status: DC | PRN
  Administered 2015-06-24: 2.5 mL

## 2015-06-24 MED ORDER — PROPOFOL 10 MG/ML IV BOLUS
INTRAVENOUS | Status: DC | PRN
  Administered 2015-06-24: 150 mg via INTRAVENOUS
  Administered 2015-06-24: 50 mg via INTRAVENOUS

## 2015-06-24 MED ORDER — MIDAZOLAM HCL 2 MG/2ML IJ SOLN
INTRAMUSCULAR | Status: AC
  Filled 2015-06-24: qty 2

## 2015-06-24 MED ORDER — LIDOCAINE HCL (CARDIAC) 20 MG/ML IV SOLN
INTRAVENOUS | Status: AC
  Filled 2015-06-24: qty 5

## 2015-06-24 MED ORDER — HYDROCODONE-ACETAMINOPHEN 5-325 MG PO TABS: 1.0000 | ORAL_TABLET | ORAL | Status: DC | PRN

## 2015-06-24 MED ORDER — LIDOCAINE-EPINEPHRINE (PF) 1 %-1:200000 IJ SOLN
INTRAMUSCULAR | Status: AC
  Filled 2015-06-24: qty 30

## 2015-06-24 MED ORDER — DEXAMETHASONE SODIUM PHOSPHATE 4 MG/ML IJ SOLN
INTRAMUSCULAR | Status: DC | PRN
  Administered 2015-06-24: 10 mg via INTRAVENOUS

## 2015-06-24 MED ORDER — DEXAMETHASONE SODIUM PHOSPHATE 10 MG/ML IJ SOLN
INTRAMUSCULAR | Status: AC
  Filled 2015-06-24: qty 1

## 2015-06-24 MED ORDER — MEPERIDINE HCL 25 MG/ML IJ SOLN: 6.2500 mg | INTRAMUSCULAR | Status: DC | PRN

## 2015-06-24 MED ORDER — GLYCOPYRROLATE 0.2 MG/ML IJ SOLN
INTRAMUSCULAR | Status: AC
  Filled 2015-06-24: qty 1

## 2015-06-24 MED ORDER — SCOPOLAMINE 1 MG/3DAYS TD PT72: 1.0000 | MEDICATED_PATCH | Freq: Once | TRANSDERMAL | Status: DC | PRN

## 2015-06-24 MED ORDER — SCOPOLAMINE 1 MG/3DAYS TD PT72
MEDICATED_PATCH | TRANSDERMAL | Status: AC
  Filled 2015-06-24: qty 1

## 2015-06-24 MED ORDER — LIDOCAINE HCL (CARDIAC) 20 MG/ML IV SOLN
INTRAVENOUS | Status: DC | PRN
  Administered 2015-06-24: 50 mg via INTRAVENOUS

## 2015-06-24 MED ORDER — BUPIVACAINE HCL (PF) 0.25 % IJ SOLN
INTRAMUSCULAR | Status: DC | PRN
  Administered 2015-06-24: 2.5 mL

## 2015-06-24 MED ORDER — CEFAZOLIN SODIUM-DEXTROSE 2-3 GM-% IV SOLR
INTRAVENOUS | Status: AC
  Filled 2015-06-24: qty 50

## 2015-06-24 MED ORDER — LACTATED RINGERS IV SOLN
INTRAVENOUS | Status: DC
Start: 2015-06-24 — End: 2015-06-24
  Administered 2015-06-24: 15:00:00 via INTRAVENOUS
  Administered 2015-06-24: 10 mL/h via INTRAVENOUS

## 2015-06-24 MED ORDER — FENTANYL CITRATE (PF) 100 MCG/2ML IJ SOLN
INTRAMUSCULAR | Status: DC | PRN
  Administered 2015-06-24: 100 ug via INTRAVENOUS

## 2015-06-24 MED ORDER — HYDROMORPHONE HCL 1 MG/ML IJ SOLN: 0.2500 mg | INTRAMUSCULAR | Status: DC | PRN

## 2015-06-24 MED ORDER — MIDAZOLAM HCL 2 MG/2ML IJ SOLN: 1.0000 mg | INTRAMUSCULAR | Status: DC | PRN

## 2015-06-24 SURGICAL SUPPLY — 53 items
ADH SKN CLS APL DERMABOND .7 (GAUZE/BANDAGES/DRESSINGS) ×1
BINDER BREAST XLRG (GAUZE/BANDAGES/DRESSINGS) ×1 IMPLANT
BLADE HEX COATED 2.75 (ELECTRODE) ×2 IMPLANT
BLADE SURG 15 STRL LF DISP TIS (BLADE) ×1 IMPLANT
BLADE SURG 15 STRL SS (BLADE) ×2
CANISTER SUCT 1200ML W/VALVE (MISCELLANEOUS) ×2 IMPLANT
CHLORAPREP W/TINT 26ML (MISCELLANEOUS) ×2 IMPLANT
CLIP TI LARGE 6 (CLIP) IMPLANT
CLSR STERI-STRIP ANTIMIC 1/2X4 (GAUZE/BANDAGES/DRESSINGS) ×1 IMPLANT
COVER BACK TABLE 60X90IN (DRAPES) ×2 IMPLANT
COVER MAYO STAND STRL (DRAPES) ×2 IMPLANT
DECANTER SPIKE VIAL GLASS SM (MISCELLANEOUS) IMPLANT
DERMABOND ADVANCED (GAUZE/BANDAGES/DRESSINGS) ×1
DERMABOND ADVANCED .7 DNX12 (GAUZE/BANDAGES/DRESSINGS) IMPLANT
DEVICE DUBIN W/COMP PLATE 8390 (MISCELLANEOUS) IMPLANT
DRAPE LAPAROTOMY 100X72 PEDS (DRAPES) ×2 IMPLANT
DRAPE UTILITY XL STRL (DRAPES) ×4 IMPLANT
DRSG PAD ABDOMINAL 8X10 ST (GAUZE/BANDAGES/DRESSINGS) IMPLANT
ELECT REM PT RETURN 9FT ADLT (ELECTROSURGICAL) ×2
ELECTRODE REM PT RTRN 9FT ADLT (ELECTROSURGICAL) ×1 IMPLANT
GLOVE BIO SURGEON STRL SZ 6 (GLOVE) ×2 IMPLANT
GLOVE BIOGEL PI IND STRL 6.5 (GLOVE) ×1 IMPLANT
GLOVE BIOGEL PI INDICATOR 6.5 (GLOVE) ×1
GOWN STRL REUS W/ TWL LRG LVL3 (GOWN DISPOSABLE) ×1 IMPLANT
GOWN STRL REUS W/TWL 2XL LVL3 (GOWN DISPOSABLE) ×2 IMPLANT
GOWN STRL REUS W/TWL LRG LVL3 (GOWN DISPOSABLE) ×2
ILLUMINATOR WAVEGUIDE N/F (MISCELLANEOUS) IMPLANT
KIT MARKER MARGIN INK (KITS) IMPLANT
LIGHT WAVEGUIDE WIDE FLAT (MISCELLANEOUS) IMPLANT
NDL HYPO 25X1 1.5 SAFETY (NEEDLE) ×1 IMPLANT
NEEDLE HYPO 25X1 1.5 SAFETY (NEEDLE) ×2 IMPLANT
NS IRRIG 1000ML POUR BTL (IV SOLUTION) ×2 IMPLANT
PACK BASIN DAY SURGERY FS (CUSTOM PROCEDURE TRAY) ×2 IMPLANT
PENCIL BUTTON HOLSTER BLD 10FT (ELECTRODE) ×2 IMPLANT
SLEEVE SCD COMPRESS KNEE MED (MISCELLANEOUS) ×2 IMPLANT
SPONGE GAUZE 4X4 12PLY STER LF (GAUZE/BANDAGES/DRESSINGS) ×2 IMPLANT
SPONGE LAP 18X18 X RAY DECT (DISPOSABLE) ×2 IMPLANT
STAPLER VISISTAT 35W (STAPLE) IMPLANT
STRIP CLOSURE SKIN 1/2X4 (GAUZE/BANDAGES/DRESSINGS) ×2 IMPLANT
SUT MON AB 4-0 PC3 18 (SUTURE) ×2 IMPLANT
SUT SILK 2 0 SH (SUTURE) ×1 IMPLANT
SUT VIC AB 2-0 SH 27 (SUTURE)
SUT VIC AB 2-0 SH 27XBRD (SUTURE) IMPLANT
SUT VIC AB 3-0 54X BRD REEL (SUTURE) IMPLANT
SUT VIC AB 3-0 BRD 54 (SUTURE)
SUT VIC AB 3-0 SH 27 (SUTURE) ×2
SUT VIC AB 3-0 SH 27X BRD (SUTURE) ×1 IMPLANT
SYR BULB 3OZ (MISCELLANEOUS) ×2 IMPLANT
SYR CONTROL 10ML LL (SYRINGE) ×2 IMPLANT
TOWEL OR 17X24 6PK STRL BLUE (TOWEL DISPOSABLE) ×2 IMPLANT
TOWEL OR NON WOVEN STRL DISP B (DISPOSABLE) ×2 IMPLANT
TUBE CONNECTING 20X1/4 (TUBING) ×2 IMPLANT
YANKAUER SUCT BULB TIP NO VENT (SUCTIONS) ×2 IMPLANT

## 2015-06-24 NOTE — Op Note (Signed)
Excisional Breast Biopsy (excision of left nipple)  Indications: This patient presents with history of bleeding left nipple, biopsy as nipple adenoma, and non healing after punch biopsy  Pre-operative Diagnosis: left nipple adenoma  Post-operative Diagnosis: Same  Surgeon: [name]   Anesthesia: General LMA anesthesia and Local anesthesia 1% plain lidocaine, 0.25.% bupivacaine, with epinephrine  ASA Class: 2  Procedure Details  The patient was seen in the Holding Room. The risks, benefits, complications, treatment options, and expected outcomes were discussed with the patient. The possibilities of reaction to medication, pulmonary aspiration, bleeding, infection, the need for additional procedures, failure to diagnose a condition, and creating a complication requiring transfusion or operation were discussed with the patient. The patient concurred with the proposed plan, giving informed consent.  The site of surgery properly noted/marked. The patient was taken to Operating Room # 8, identified, and the procedure verified as Excision of left nipple. A Time Out was held and the above information confirmed.  After induction of anesthesia, the left  breast and chest were prepped and draped in standard fashion. Local anesthetic was administered under the nipple.   An elliptical incision was made around the nipple.   Dissection was carried down to normal fatty breast tissue..  Orientation sutures were placed.  Hemostasis was achieved with cautery.  The wound was irrigated and closed with a 3-0 Vicryl interrupted deep dermal stitch and a 4-0 Monocryl subcuticular closure in layers.    Sterile dressings were applied. At the end of the operation, all sponge, instrument, and needle counts were correct.  Findings: grossly clear surgical margins  Estimated Blood Loss:  Minimal             Specimens: left nipple         Complications:  None; patient tolerated the procedure well.          Disposition: PACU - hemodynamically stable.         Condition: stable

## 2015-06-24 NOTE — Discharge Instructions (Addendum)
[facility] °Office Phone Number [phone] ° °BREAST BIOPSY/ PARTIAL MASTECTOMY: POST OP INSTRUCTIONS ° °Always review your discharge instruction sheet given to you by the facility where your surgery was performed. ° °IF YOU HAVE DISABILITY OR FAMILY LEAVE FORMS, YOU MUST BRING THEM TO THE OFFICE FOR PROCESSING.  DO NOT GIVE THEM TO YOUR DOCTOR. ° °1. A prescription for pain medication may be given to you upon discharge.  Take your pain medication as prescribed, if needed.  If narcotic pain medicine is not needed, then you may take acetaminophen (Tylenol) or ibuprofen (Advil) as needed. °2. Take your usually prescribed medications unless otherwise directed °3. If you need a refill on your pain medication, please contact your pharmacy.  They will contact our office to request authorization.  Prescriptions will not be filled after 5pm or on week-ends. °4. You should eat very light the first 24 hours after surgery, such as soup, crackers, pudding, etc.  Resume your normal diet the day after surgery. °5. Most patients will experience some swelling and bruising in the breast.  Ice packs and a good support bra will help.  Swelling and bruising can take several days to resolve.  °6. It is common to experience some constipation if taking pain medication after surgery.  Increasing fluid intake and taking a stool softener will usually help or prevent this problem from occurring.  A mild laxative (Milk of Magnesia or Miralax) should be taken according to package directions if there are no bowel movements after 48 hours. °7. Unless discharge instructions indicate otherwise, you may remove your bandages 48 hours after surgery, and you may shower at that time.  You may have steri-strips (small skin tapes) in place directly over the incision.  These strips should be left on the skin for 7-10 days.   Any sutures or staples will be removed at the office during your follow-up visit. °8. ACTIVITIES:  You may resume  regular daily activities (gradually increasing) beginning the next day.  Wearing a good support bra or sports bra (or the breast binder) minimizes pain and swelling.  You may have sexual intercourse when it is comfortable. °a. You may drive when you no longer are taking prescription pain medication, you can comfortably wear a seatbelt, and you can safely maneuver your car and apply brakes. °b. RETURN TO WORK:  __________1 week_______________ °9. You should see your doctor in the office for a follow-up appointment approximately two weeks after your surgery.  Your doctor’s nurse will typically make your follow-up appointment when she calls you with your pathology report.  Expect your pathology report 2-3 business days after your surgery.  You may call to check if you do not hear from us after three days. ° ° °WHEN TO CALL YOUR DOCTOR: °1. Fever over 101.0 °2. Nausea and/or vomiting. °3. Extreme swelling or bruising. °4. Continued bleeding from incision. °5. Increased pain, redness, or drainage from the incision. ° °The clinic staff is available to answer your questions during regular business hours.  Please don’t hesitate to call and ask to speak to one of the nurses for clinical concerns.  If you have a medical emergency, go to the nearest emergency room or call 911.  A surgeon from [facility] is always on call at the hospital. ° °For further questions, please visit [email or web address]  ° ° °Post Anesthesia Home Care Instructions ° °Activity: °Get plenty of rest for the remainder of the day. A responsible adult should stay with you for 24   hours following the procedure.  °For the next 24 hours, DO NOT: °-Drive a car °-Operate machinery °-Drink alcoholic beverages °-Take any medication unless instructed by your physician °-Make any legal decisions or sign important papers. ° °Meals: °Start with liquid foods such as gelatin or soup. Progress to regular foods as tolerated. Avoid greasy, spicy, heavy  foods. If nausea and/or vomiting occur, drink only clear liquids until the nausea and/or vomiting subsides. Call your physician if vomiting continues. ° °Special Instructions/Symptoms: °Your throat may feel dry or sore from the anesthesia or the breathing tube placed in your throat during surgery. If this causes discomfort, gargle with warm salt water. The discomfort should disappear within 24 hours. ° °If you had a scopolamine patch placed behind your ear for the management of post- operative nausea and/or vomiting: ° °1. The medication in the patch is effective for 72 hours, after which it should be removed.  Wrap patch in a tissue and discard in the trash. Wash hands thoroughly with soap and water. °2. You may remove the patch earlier than 72 hours if you experience unpleasant side effects which may include dry mouth, dizziness or visual disturbances. °3. Avoid touching the patch. Wash your hands with soap and water after contact with the patch. °  ° °

## 2015-06-24 NOTE — Anesthesia Procedure Notes (Signed)
Procedure Name: LMA Insertion Date/Time: 06/24/2015 2:24 PM Performed by: Toula Moos L Pre-anesthesia Checklist: Patient identified, Emergency Drugs available, Suction available, Patient being monitored and Timeout performed Patient Re-evaluated:Patient Re-evaluated prior to inductionOxygen Delivery Method: Circle System Utilized Preoxygenation: Pre-oxygenation with 100% oxygen Intubation Type: IV induction Ventilation: Mask ventilation without difficulty LMA: LMA inserted LMA Size: 4.0 Number of attempts: 1 Airway Equipment and Method: Bite block Placement Confirmation: positive ETCO2 Tube secured with: Tape Dental Injury: Teeth and Oropharynx as per pre-operative assessment

## 2015-06-24 NOTE — Transfer of Care (Signed)
Immediate Anesthesia Transfer of Care Note  Patient: Denise Ayala  Procedure(s) Performed: Procedure(s): LEFT NIPPLE EXCISION (Left)  Patient Location: PACU  Anesthesia Type:General  Level of Consciousness: awake and patient cooperative  Airway & Oxygen Therapy: Patient Spontanous Breathing and Patient connected to face mask oxygen  Post-op Assessment: Report given to RN and Post -op Vital signs reviewed and stable  Post vital signs: Reviewed and stable  Last Vitals:  Filed Vitals:   06/24/15 1220 06/24/15 1458  BP: 154/69 125/65  Pulse: 50   Temp: 36.6 C   Resp: 18     Complications: No apparent anesthesia complications

## 2015-06-24 NOTE — Interval H&P Note (Signed)
History and Physical Interval Note:  06/24/2015 2:16 PM  Denise Ayala  has presented today for surgery, with the diagnosis of [place]  The various methods of treatment have been discussed with the patient and family. After consideration of risks, benefits and other options for treatment, the patient has consented to  Procedure(s): LEFT NIPPLE EXCISION (Left) as a surgical intervention .  The patient's history has been reviewed, patient examined, no change in status, stable for surgery.  I have reviewed the patient's chart and labs.  Questions were answered to the patient's satisfaction.     Maurianna Benard

## 2015-06-24 NOTE — Anesthesia Preprocedure Evaluation (Signed)
Anesthesia Evaluation  Patient identified by MRN, date of birth, ID band Patient awake    Reviewed: Allergy & Precautions, NPO status , Patient's Chart, lab work & pertinent test results  Airway Mallampati: II  TM Distance: >3 FB Neck ROM: Full    Dental no notable dental hx.    Pulmonary neg pulmonary ROS, former smoker,    Pulmonary exam normal breath sounds clear to auscultation       Cardiovascular negative cardio ROS Normal cardiovascular exam Rhythm:Regular Rate:Normal     Neuro/Psych negative neurological ROS  negative psych ROS   GI/Hepatic negative GI ROS, Neg liver ROS,   Endo/Other  negative endocrine ROS  Renal/GU negative Renal ROS     Musculoskeletal  (+) Arthritis ,   Abdominal   Peds  Hematology negative hematology ROS (+)   Anesthesia Other Findings   Reproductive/Obstetrics negative OB ROS                             Anesthesia Physical Anesthesia Plan  ASA: II  Anesthesia Plan: General   Post-op Pain Management:    Induction: Intravenous  Airway Management Planned: LMA  Additional Equipment: None  Intra-op Plan:   Post-operative Plan: Extubation in OR  Informed Consent: I have reviewed the patients History and Physical, chart, labs and discussed the procedure including the risks, benefits and alternatives for the proposed anesthesia with the patient or authorized representative who has indicated his/her understanding and acceptance.   Dental advisory given  Plan Discussed with: CRNA  Anesthesia Plan Comments:         Anesthesia Quick Evaluation

## 2015-06-25 ENCOUNTER — Encounter (HOSPITAL_BASED_OUTPATIENT_CLINIC_OR_DEPARTMENT_OTHER): Payer: Self-pay | Admitting: General Surgery

## 2015-06-25 NOTE — Anesthesia Postprocedure Evaluation (Signed)
Anesthesia Post Note  Patient: Denise Ayala  Procedure(s) Performed: Procedure(s) (LRB): LEFT NIPPLE EXCISION (Left)  Patient location during evaluation: PACU Anesthesia Type: General Level of consciousness: sedated and patient cooperative Pain management: pain level controlled Vital Signs Assessment: post-procedure vital signs reviewed and stable Respiratory status: spontaneous breathing Cardiovascular status: stable Anesthetic complications: no    Last Vitals:  Filed Vitals:   06/24/15 1532 06/24/15 1600  BP:  144/80  Pulse:  78  Temp: 36.4 C 36.6 C  Resp: 16 16    Last Pain:  Filed Vitals:   06/25/15 0934  PainSc: 0-No pain                 Nolon Nations

## 2016-12-27 ENCOUNTER — Emergency Department (HOSPITAL_COMMUNITY): Payer: Medicare Other

## 2016-12-27 ENCOUNTER — Emergency Department (HOSPITAL_COMMUNITY)
Admission: EM | Admit: 2016-12-27 | Discharge: 2016-12-27 | Disposition: A | Discharge status: Home / Self Care | Payer: Medicare Other | Attending: Emergency Medicine | Admitting: Emergency Medicine

## 2016-12-27 ENCOUNTER — Encounter (HOSPITAL_COMMUNITY): Payer: Self-pay | Admitting: Emergency Medicine

## 2016-12-27 DIAGNOSIS — R55 Syncope and collapse: Secondary | ICD-10-CM

## 2016-12-27 DIAGNOSIS — E876 Hypokalemia: Secondary | ICD-10-CM | POA: Diagnosis not present

## 2016-12-27 DIAGNOSIS — Z79899 Other long term (current) drug therapy: Secondary | ICD-10-CM | POA: Diagnosis not present

## 2016-12-27 DIAGNOSIS — Z87891 Personal history of nicotine dependence: Secondary | ICD-10-CM | POA: Insufficient documentation

## 2016-12-27 HISTORY — DX: Syncope and collapse: R55

## 2016-12-27 LAB — BASIC METABOLIC PANEL
Anion gap: 8 (ref 5–15)
BUN: 16 mg/dL (ref 6–20)
CHLORIDE: 102 mmol/L (ref 101–111)
CO2: 26 mmol/L (ref 22–32)
CREATININE: 0.82 mg/dL (ref 0.44–1.00)
Calcium: 8.8 mg/dL — ABNORMAL LOW (ref 8.9–10.3)
GFR calc Af Amer: >60 mL/min (ref >60)
GFR calc non Af Amer: >60 mL/min (ref >60)
Glucose, Bld: 113 mg/dL — ABNORMAL HIGH (ref 65–99)
Potassium: 3.4 mmol/L — ABNORMAL LOW (ref 3.5–5.1)
SODIUM: 136 mmol/L (ref 135–145)

## 2016-12-27 LAB — I-STAT TROPONIN, ED: Troponin i, poc: 0 ng/mL (ref 0.00–0.08)

## 2016-12-27 LAB — CBC
HCT: 33 % — ABNORMAL LOW (ref 36.0–46.0)
Hemoglobin: 11.3 g/dL — ABNORMAL LOW (ref 12.0–15.0)
MCH: 32.5 pg (ref 26.0–34.0)
MCHC: 34.2 g/dL (ref 30.0–36.0)
MCV: 94.8 fL (ref 78.0–100.0)
PLATELETS: 227 10*3/uL (ref 150–400)
RBC: 3.48 MIL/uL — ABNORMAL LOW (ref 3.87–5.11)
RDW: 12.6 % (ref 11.5–15.5)
WBC: 7.1 10*3/uL (ref 4.0–10.5)

## 2016-12-27 LAB — URINALYSIS, ROUTINE W REFLEX MICROSCOPIC
BILIRUBIN URINE: NEGATIVE
GLUCOSE, UA: NEGATIVE mg/dL
HGB URINE DIPSTICK: NEGATIVE
KETONES UR: NEGATIVE mg/dL
Leukocytes, UA: NEGATIVE
Nitrite: NEGATIVE
PH: 6 (ref 5.0–8.0)
PROTEIN: NEGATIVE mg/dL
Specific Gravity, Urine: 1.015 (ref 1.005–1.030)

## 2016-12-27 LAB — ETHANOL: Alcohol, Ethyl (B): <5 mg/dL (ref <5)

## 2016-12-27 MED ORDER — METOPROLOL SUCCINATE ER 50 MG PO TB24: 50.0000 mg | ORAL_TABLET | Freq: Every day | ORAL | 0 refills | Status: DC

## 2016-12-27 MED ORDER — SODIUM CHLORIDE 0.9 % IV BOLUS (SEPSIS)
1000.0000 mL | Freq: Once | INTRAVENOUS | Status: AC
  Administered 2016-12-27: 1000 mL via INTRAVENOUS

## 2016-12-27 MED ORDER — ONDANSETRON HCL 4 MG/2ML IJ SOLN
4.0000 mg | Freq: Once | INTRAMUSCULAR | Status: AC
  Administered 2016-12-27: 4 mg via INTRAVENOUS
  Filled 2016-12-27: qty 2

## 2016-12-27 MED ORDER — POTASSIUM CHLORIDE ER 20 MEQ PO TBCR: 20.0000 meq | EXTENDED_RELEASE_TABLET | Freq: Every day | ORAL | 0 refills | Status: DC

## 2016-12-27 NOTE — ED Notes (Signed)
Pt in CT at this time, family at bedside

## 2016-12-27 NOTE — ED Notes (Signed)
Pt ambulated to bathroom with no assistance. Pt stated she felt well walking to the bathroom and back to her room

## 2016-12-27 NOTE — ED Triage Notes (Signed)
Pt transported from home by [facility], per EMS pt awoke with leg cramps, attempted to walk to kitchen, husband heard "thud", found patient on kitchen floor, awake, attempted to sit up had second syncopal episode. Pt alert & oriented on EMS arrival, denies pain. Denies shob, +nausea.

## 2016-12-27 NOTE — ED Notes (Signed)
Dr. Horton at bedside. 

## 2016-12-27 NOTE — ED Provider Notes (Signed)
[facility] DEPT Provider Note   CSN: [id number] Arrival date & time: 12/27/16  0302  By signing my name below, I, Dora Sims, attest that this documentation has been prepared under the direction and in the presence of physician practitioner, Horton, Barbette Hair, MD. Electronically Signed: Dora Sims, Scribe. 12/27/2016. 3:38 AM.  History   Chief Complaint Chief Complaint  Patient presents with  . Loss of Consciousness   The history is provided by the patient and the spouse. No language interpreter was used.    HPI Comments: Denise Ayala is a 66 y.o. female with PMHx including A-Fib who presents to the Emergency Department via EMS for evaluation of two brief syncopal episodes that occurred shortly PTA. She states she awoke from sleep and had cramps in her legs; she walked to the kitchen and next remembers lying on the kitchen floor while her husband and grandson attempted to help her. She states that she attempted to stand up from the floor with assistance from her family members and lost consciousness again. No head trauma. She believes each of her syncopal episodes lasted for a few seconds. Husband notes that patient complained of nausea after regaining consciousness the second time and she endorses nausea at interview without any episodes of vomiting. Patient is also reporting some mild dizziness upon standing currently but notes she did not feel dizzy prior to her syncopal episodes. She has no h/o the same. She notes her leg cramps have resolved. Patient believes she has been hydrating adequately. No recent illnesses. Patient denies headaches, dyspnea, chest pain, slurred speech, facial asymmetry, numbness/tingling, focal weakness, vision changes, or any other associated symptoms.  Past Medical History:  Diagnosis Date  . Arthritis   . Atrial fibrillation ([place])   . Breast adenoma 2016   left    There are no active problems to display for this patient.   Past Surgical  History:  Procedure Laterality Date  . ABDOMINAL HYSTERECTOMY    . BREAST BIOPSY Left 06/24/2015   Procedure: LEFT NIPPLE EXCISION;  Surgeon: [name], MD;  Location: [facility];  Service: General;  Laterality: Left;  . BREAST SURGERY Right    biopsy  . CHOLECYSTECTOMY    . FOOT FUSION    . REPLACEMENT TOTAL KNEE Right 2012, 2010    OB History    No data available       Home Medications    Prior to Admission medications   Medication Sig Start Date End Date Taking? Authorizing Provider  diclofenac (VOLTAREN) 50 MG EC tablet Take 50 mg by mouth 2 (two) times daily.    [provider]  estradiol (ESTRACE) 1 MG tablet Take 1 mg by mouth daily.    [provider]  HYDROcodone-acetaminophen (NORCO/VICODIN) 5-325 MG tablet Take 1-2 tablets by mouth every 4 (four) hours as needed for moderate pain or severe pain. 06/24/15   [name], MD  metoprolol succinate (TOPROL-XL) 50 MG 24 hr tablet Take 1 tablet (50 mg total) by mouth daily. Take with or immediately following a meal. 12/27/16   Horton, Barbette Hair, MD  potassium chloride 20 MEQ TBCR Take 20 mEq by mouth daily. 12/27/16   Horton, Barbette Hair, MD    Family History No family history on file.  Social History Social History  Substance Use Topics  . Smoking status: Former Smoker    Quit date: 08/03/1990  . Smokeless tobacco: Never Used  . Alcohol use Yes     Comment: occas wine  Allergies   Codeine   Review of Systems Review of Systems  Eyes: Negative for visual disturbance.  Respiratory: Negative for shortness of breath.   Cardiovascular: Negative for chest pain.  Gastrointestinal: Positive for nausea. Negative for vomiting.  Musculoskeletal: Positive for myalgias (resolved).  Skin: Negative for wound.  Neurological: Positive for dizziness and syncope. Negative for facial asymmetry, speech difficulty, weakness, numbness and headaches.  All other systems reviewed and are  negative.  Physical Exam Updated Vital Signs BP (!) 118/50   Pulse (!) 38   Temp 98.2 F (36.8 C) (Oral)   Resp 14   Ht 5\' 5"  (1.651 m)   Wt 74.8 kg (165 lb)   SpO2 99%   BMI 27.46 kg/m   Physical Exam  Constitutional: She is oriented to person, place, and time. She appears well-developed and well-nourished.  Appears younger than stated age  HENT:  Head: Normocephalic and atraumatic.  Eyes: Pupils are equal, round, and reactive to light.  Neck: Neck supple.  Cardiovascular: Normal rate and normal heart sounds.   No murmur heard. Irregular rhythm  Pulmonary/Chest: Effort normal. No respiratory distress. She has no wheezes.  Abdominal: Soft. Bowel sounds are normal. There is no tenderness. There is no guarding.  Neurological: She is alert and oriented to person, place, and time.  Cranial nerves II through XII intact, 5 out of 5 strength in all 4 extremities, no dysmetria to finger-nose-finger  Skin: Skin is warm and dry.  Psychiatric: She has a normal mood and affect.  Nursing note and vitals reviewed.  ED Treatments / Results  Labs (all labs ordered are listed, but only abnormal results are displayed) Labs Reviewed  BASIC METABOLIC PANEL - Abnormal; Notable for the following:       Result Value   Potassium 3.4 (*)    Glucose, Bld 113 (*)    Calcium 8.8 (*)    All other components within normal limits  CBC - Abnormal; Notable for the following:    RBC 3.48 (*)    Hemoglobin 11.3 (*)    HCT 33.0 (*)    All other components within normal limits  URINALYSIS, ROUTINE W REFLEX MICROSCOPIC  ETHANOL  I-STAT TROPOININ, ED  CBG MONITORING, ED    EKG  EKG Interpretation  Date/Time:  Sunday Dec 27 2016 03:19:03 EDT Ventricular Rate:  61 PR Interval:    QRS Duration: 101 QT Interval:  454 QTC Calculation: 458 R Axis:   40 Text Interpretation:  Sinus rhythm Borderline T abnormalities, anterior leads Confirmed by Thayer Jew [phone]) on 12/27/2016 7:36:56 AM        EKG Interpretation  Date/Time:  Sunday Dec 27 2016 07:28:30 EDT Ventricular Rate:  64 PR Interval:    QRS Duration: 93 QT Interval:  431 QTC Calculation: 445 R Axis:   44 Text Interpretation:  Sinus rhythm Borderline T abnormalities, anterior leads Confirmed by Thayer Jew [phone]) on 12/27/2016 7:37:37 AM         Radiology Ct Head Wo Contrast  Result Date: 12/27/2016 CLINICAL DATA:  Syncopal episode, found on kitchen floor. Nausea. History of atrial fibrillation. EXAM: CT HEAD WITHOUT CONTRAST TECHNIQUE: Contiguous axial images were obtained from the base of the skull through the vertex without intravenous contrast. COMPARISON:  None. FINDINGS: BRAIN: No intraparenchymal hemorrhage, mass effect nor midline shift. 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 No acute large vascular territory infarcts. No abnormal extra-axial fluid collections. Basal cisterns are patent.  VASCULAR: Mild calcific atherosclerosis of the carotid siphons. SKULL: No skull fracture. No significant scalp soft tissue swelling. SINUSES/ORBITS: Partially calcified sphenoid mucosal retention cysts, trace paranasal sinus mucosal thickening. Mastoid air cells are well aerated.The included ocular globes and orbital contents are non-suspicious. OTHER: None. IMPRESSION: No acute intracranial process. Old LEFT basal ganglia infarct, otherwise negative CT HEAD for age. Electronically Signed   By: Elon Alas M.D.   On: 12/27/2016 05:18    Procedures Procedures (including critical care time)  DIAGNOSTIC STUDIES: Oxygen Saturation is 98% on RA, normal by my interpretation.    COORDINATION OF CARE: 3:36 AM Discussed treatment plan with pt at bedside and pt agreed to plan.  Medications Ordered in ED Medications  sodium chloride 0.9 % bolus 1,000 mL (0 mLs Intravenous Stopped 12/27/16 0624)  ondansetron (ZOFRAN) injection 4 mg (4 mg Intravenous Given  12/27/16 0339)     Initial Impression / Assessment and Plan / ED Course  I have reviewed the triage vital signs and the nursing notes.  Pertinent labs & imaging results that were available during my care of the patient were reviewed by me and considered in my medical decision making (see chart for details).     Patient presents with syncope. Appears unprovoked. She is nontoxic and nonfocal. Only complaint at the moment is nausea. Does not sound like seizure. My only concern is that she had no prodrome. Initial EKG sinus rhythm. Heart rate is 60. She takes 100 mg of metoprolol daily. No recent medication changes. Nursing noted irregularity on the monitor. It appears that she is in a sinus rhythm with occasional PACs. No evidence of atrial fibrillation. Workup is largely unremarkable. She does have mild hypokalemia. CT head negative. Orthostatics negative. On multiple rechecks she states that she feels good. Technically per [facility] syncope rules, she is low risk however, the episodes do not appear vasovagal either. I discussed with the patient options including admission for telemetry monitoring versus discharge and close follow-up with cardiology. Patient does not wish to be admitted. She understands the risk and benefits of discharge. She ambulates independently and without symptoms. I have recommended decreasing metoprolol to 50 mg daily given that her heart rate is so low and this could be contributing. She is to follow-up closely with her primary physician and cardiology.  After history, exam, and medical workup I feel the patient has been appropriately medically screened and is safe for discharge home. Pertinent diagnoses were discussed with the patient. Patient was given return precautions.   Final Clinical Impressions(s) / ED Diagnoses   Final diagnoses:  Syncope, unspecified syncope type  Hypokalemia    New Prescriptions New Prescriptions   POTASSIUM CHLORIDE 20 MEQ TBCR    Take  20 mEq by mouth daily.   I personally performed the services described in this documentation, which was scribed in my presence. The recorded information has been reviewed and is accurate.    Merryl Hacker, MD 12/27/16 [phone]

## 2016-12-27 NOTE — ED Notes (Signed)
Nurse drawing labs. 

## 2016-12-27 NOTE — Discharge Instructions (Signed)
You were seen today for an episode of loss of consciousness. Your workup here is reassuring. You are fairly low risk to me: However, given that you did not have a prodrome with your syncope, arrhythmias are always a consideration. However, given your negative workup and wishes to go home, feel it is safe for you to follow-up closely with cardiology and her primary physician. If you develop any new or worsening symptoms she needs to be reevaluated immediately. Your potassium levels were just a touch low. Take supplemental potassium for the next 5 days. Have it rechecked by your primary physician.

## 2016-12-30 ENCOUNTER — Encounter: Payer: Self-pay | Admitting: Internal Medicine

## 2016-12-30 ENCOUNTER — Ambulatory Visit (INDEPENDENT_AMBULATORY_CARE_PROVIDER_SITE_OTHER): Payer: Medicare Other | Admitting: Internal Medicine

## 2016-12-30 VITALS — BP 140/80 | HR 60 | Ht 65.0 in | Wt 173.8 lb

## 2016-12-30 DIAGNOSIS — I480 Paroxysmal atrial fibrillation: Secondary | ICD-10-CM | POA: Diagnosis not present

## 2016-12-30 DIAGNOSIS — R55 Syncope and collapse: Secondary | ICD-10-CM | POA: Diagnosis not present

## 2016-12-30 DIAGNOSIS — I10 Essential (primary) hypertension: Secondary | ICD-10-CM | POA: Diagnosis not present

## 2016-12-30 DIAGNOSIS — E876 Hypokalemia: Secondary | ICD-10-CM | POA: Diagnosis not present

## 2016-12-30 NOTE — Patient Instructions (Signed)
Medication Instructions:  Your physician has recommended you make the following change in your medication:  1) Stop HCTZ   Labwork: None ordered  Testing/Procedures: Your physician has requested that you have an echocardiogram. Echocardiography is a painless test that uses sound waves to create images of your heart. It provides your doctor with information about the size and shape of your heart and how well your heart's chambers and valves are working. This procedure takes approximately one hour. There are no restrictions for this procedure.  Your physician has recommended that you wear an event monitor. Event monitors are medical devices that record the heart's electrical activity. Doctors most often [place] these monitors to diagnose arrhythmias. Arrhythmias are problems with the speed or rhythm of the heartbeat. The monitor is a small, portable device. You can wear one while you do your normal daily activities. This is usually used to diagnose what is causing palpitations/syncope (passing out).    Follow-Up: Your physician recommends that you schedule a follow-up appointment in: 6 weeks with Dr Rayann Heman   NO DRIVING for 6 weeks   Any Other Special Instructions Will Be Listed Below (If Applicable).     If you need a refill on your cardiac medications before your next appointment, please call your pharmacy.

## 2016-12-30 NOTE — Progress Notes (Signed)
Electrophysiology Office Note   Date:  12/30/2016   ID:  Denise Ayala, DOB 14-May-1951, MRN [id number]  PCP:  Christain Sacramento, MD  Cardiologist:  Previously Dr Lia Foyer Primary Electrophysiologist: Thompson Grayer, MD    Chief Complaint  Patient presents with  . New Patient (Initial Visit)    Syncope     History of Present Illness: Denise Ayala is a 66 y.o. female who presents today for electrophysiology evaluation.   She is referred by Dr Redmond Pulling for further evaluation of syncope.  The patient presented to [facility] 12/27/16 after having syncope.  She reports having severe leg cramps.  She went to the kitchen to eat mustard.  While preparing the mustard, she collapsed to the floor.  She woke up and tried to stand but noted that she was weak and the room was "spinning".  She have a second episode of brief syncope.  She denies palpitations, chest pain, or other symptoms.   She was evaluated in the ED and discharged to follow-up with cardiology. She reports no prior episodes of syncope.  She does report occasional difficulty with leg cramps.  She has had mild dizziness since her fall but feels that this is from hitting her head. She had head CT 12/27/16 which did not reveal acute abnormality or bleeding.  She denies HA, visual changes, N/V or confusion.  She has had afib in 2008 for which she was evaluated by Dr Lia Foyer.  She has not had any symptomatic episodes of afib since that time but does have PACs frequently.  Today, she denies symptoms of palpitations, chest pain, shortness of breath, orthopnea, PND, lower extremity edema, claudication, dizziness, presyncope, syncope, bleeding, or neurologic sequela. The patient is tolerating medications without difficulties and is otherwise without complaint today.    Past Medical History:  Diagnosis Date  . Arthritis   . Breast adenoma 2016   left  . DJD (degenerative joint disease)   . Hypertension   . Lichen planus   . Paroxysmal atrial  fibrillation ([facility])    single episode years ago  . Premature atrial contraction   . Syncope 12/27/2016   vasovagal by history   Past Surgical History:  Procedure Laterality Date  . ABDOMINAL HYSTERECTOMY    . BREAST BIOPSY Left 06/24/2015   Procedure: LEFT NIPPLE EXCISION;  Surgeon: [name], MD;  Location: [facility];  Service: General;  Laterality: Left;  . BREAST SURGERY Right    biopsy  . CHOLECYSTECTOMY    . FOOT FUSION    . REPLACEMENT TOTAL KNEE Right 2012, 2010     Current Outpatient Prescriptions  Medication Sig Dispense Refill  . diclofenac (VOLTAREN) 50 MG EC tablet Take 50 mg by mouth 2 (two) times daily.    . metoprolol succinate (TOPROL-XL) 100 MG 24 hr tablet Take 100 mg by mouth daily. Take with or immediately following a meal.    . potassium chloride 20 MEQ TBCR Take 20 mEq by mouth daily. 5 tablet 0   No current facility-administered medications for this visit.     Allergies:   Codeine; Doxycycline; Nabumetone; Oxaprozin; Oxycodone; and Tramadol   Social History:  The patient  reports that she quit smoking about 26 years ago. She has never used smokeless tobacco. She reports that she drinks alcohol. She reports that she does not use drugs.   Family History:  The patient's father died of CAD.  Denies FH of sudden death, arrhythmias, or syncope  ROS:  Please see the history of present illness.   All other systems are personally reviewed and negative.    PHYSICAL EXAM: VS:  BP 140/80   Pulse 60   Ht 5\' 5"  (1.651 m)   Wt 173 lb 12.8 oz (78.8 kg)   SpO2 98%   BMI 28.92 kg/m  , BMI Body mass index is 28.92 kg/m. GEN: Well nourished, well developed, in no acute distress  HEENT: normal  Neck: no JVD, carotid bruits, or masses Cardiac: RRR; no murmurs, rubs, or gallops,no edema  Respiratory:  clear to auscultation bilaterally, normal work of breathing GI: soft, nontender, nondistended, + BS MS: no deformity or atrophy  Skin: warm and dry   Neuro:  Strength and sensation are intact Psych: euthymic mood, full affect  EKG:  All ekgs in epic are reviewed personally and reveal sinus rhythm without high risk features   Recent Labs: 12/27/2016: BUN 16; Creatinine, Ser 0.82; Hemoglobin 11.3; Platelets 227; Potassium 3.4; Sodium 136  personally reviewed   Lipid Panel     Component Value Date/Time   CHOL (H) 01/05/2007 0210    237        ATP III CLASSIFICATION:  <200     mg/dL   Desirable  200-239  mg/dL   Borderline High  >=240    mg/dL   High   TRIG 38 01/05/2007 0210   HDL 80 01/05/2007 0210   CHOLHDL 3.0 01/05/2007 0210   VLDL 8 01/05/2007 0210   LDLCALC (H) 01/05/2007 0210    149        Total Cholesterol/HDL:CHD Risk Coronary Heart Disease Risk Table                     Men   Women  1/2 Average Risk   3.4   3.3   personally reviewed   Wt Readings from Last 3 Encounters:  12/30/16 173 lb 12.8 oz (78.8 kg)  12/27/16 165 lb (74.8 kg)  06/24/15 166 lb 6.4 oz (75.5 kg)      Other studies personally reviewed: Additional studies/ records that were reviewed today include: ED records  Review of the above records today demonstrates: as above   ASSESSMENT AND PLAN:  1.  Syncope Vasovagal by history, exacerbated possibly by dehydration secondary to hctz. Will obtain echo, 30 day event monitor to exclude arrhythmias Stop hctz No driving until return to see me in 6 weeks  2. htn Given low K and recent syncope, will stop hctz She will follow her BP at home Could consider losartan if BP remains elevated  3. H/o afib Well controlled (only a single episode previously in 2008) Avoid anticoagulation at this time   Follow-up:  Return to see me in 6 weeks  Current medicines are reviewed at length with the patient today.   The patient does not have concerns regarding her medicines.  The following changes were made today:  none  Labs/ tests ordered today include:  Orders Placed This Encounter  Procedures  .  Cardiac event monitor  . ECHOCARDIOGRAM COMPLETE     Signed, Thompson Grayer, MD  12/30/2016 3:41 PM     [address] [place] [id number] [phone] (office) [phone] (fax)

## 2016-12-31 ENCOUNTER — Telehealth: Payer: Self-pay | Admitting: Internal Medicine

## 2016-12-31 NOTE — Telephone Encounter (Signed)
Spoke with patient and she was only given enough pills to take until she saw Dr Rayann Heman.  She does not have any more.  As we have stopped the HCTZ no need to take the Potassium.  She is aware and will keep her follow up in 6 weeks as scheduled.

## 2016-12-31 NOTE — Telephone Encounter (Signed)
Patient calling to verify if Dr. Rayann Heman wanted to call in potassium prescription. Thanks.

## 2017-01-12 ENCOUNTER — Telehealth: Payer: Self-pay | Admitting: Internal Medicine

## 2017-01-12 NOTE — Telephone Encounter (Signed)
Pt states her HCTZ was discontinued by Dr Rayann Heman 12/30/16.  Pt is concerned about her BP, states the last 2 BP readings have been 159/88 and 162/106, heart rate 55-66. Pt states she had noticed swelling in her feet and ankles starting over the weekend, denies shortness of breath, weight is stable. Pt states edema better in the AM and is worse at the end of the day, she can still wear her shoes, doesn't feel puffy anywhere else. Pt advised to limit salt/sodium in her diet, keep her feet and legs elevated as much as possible. Pt advised I will forward to Dr Rayann Heman for review and recommendations.

## 2017-01-12 NOTE — Telephone Encounter (Signed)
New message     Pt c/o swelling: STAT is pt has developed SOB within 24 hours  1. How long have you been experiencing swelling?  Last week   2. Where is the swelling located? Both feet  3.  Are you currently taking a "fluid pill"?  No   4.  Are you currently SOB?  no  5.  Have you traveled recently? No   Pt states that swelling started after Dr Rayann Heman took her off medication, also states her bp is elevated

## 2017-01-13 ENCOUNTER — Ambulatory Visit (HOSPITAL_COMMUNITY): Payer: Medicare Other | Attending: Cardiology

## 2017-01-13 ENCOUNTER — Other Ambulatory Visit: Payer: Self-pay

## 2017-01-13 ENCOUNTER — Ambulatory Visit (INDEPENDENT_AMBULATORY_CARE_PROVIDER_SITE_OTHER): Payer: Medicare Other

## 2017-01-13 DIAGNOSIS — R55 Syncope and collapse: Secondary | ICD-10-CM | POA: Diagnosis present

## 2017-01-13 DIAGNOSIS — I10 Essential (primary) hypertension: Secondary | ICD-10-CM | POA: Insufficient documentation

## 2017-01-13 DIAGNOSIS — I4891 Unspecified atrial fibrillation: Secondary | ICD-10-CM | POA: Insufficient documentation

## 2017-01-13 DIAGNOSIS — I340 Nonrheumatic mitral (valve) insufficiency: Secondary | ICD-10-CM | POA: Diagnosis not present

## 2017-01-14 MED ORDER — LOSARTAN POTASSIUM 25 MG PO TABS: 25.0000 mg | ORAL_TABLET | Freq: Every day | ORAL | 1 refills | Status: DC

## 2017-01-14 NOTE — Telephone Encounter (Signed)
I discussed with patient, patient agreed to start losartan 25 mg daily, will follow-up with her PCP in the next  2-3 weeks.

## 2017-01-14 NOTE — Telephone Encounter (Signed)
Would start losartan 25mg  daily (which can be titrated later if needed) and have her follow-up with primary care.

## 2017-01-14 NOTE — Telephone Encounter (Signed)
Patient will follow up with Dr Redmond Pulling

## 2017-02-01 ENCOUNTER — Telehealth: Payer: Self-pay | Admitting: Internal Medicine

## 2017-02-01 NOTE — Telephone Encounter (Signed)
I spoke with pt who reports irritation to skin due to electrodes.  Has tried sensitive skin electrodes but skin still irritated.  Spoke with monitor company on Saturday who told her to remove monitor and contact our office today for further recommendations.  She saw primary care on Friday and was told to apply steroid cream to area.  She reports this has helped some.  Pt had monitor applied on 01/13/17.  She is willing to try to wear again.  I reviewed with other triage nurse and Mylanta applied to chest area and allowed to dry before applying electrodes may help with irritation.  Pt will try this and let us know if she continues to have problems.

## 2017-02-01 NOTE — Telephone Encounter (Signed)
New message   Pt verbalized that she I calling for the rn she wants to inform   Dr.Allred that she has removed the monitor and she wants to know what do he suggest

## 2017-02-02 NOTE — Telephone Encounter (Signed)
May have to discontinue monitor.  Will forward to monitor clinic to see if they have ideas. Will discuss additional options when I see her in follow-up.

## 2017-02-04 NOTE — Telephone Encounter (Signed)
Left message.  Try leaving monitor off a couple of days to see if skin improves before reapplying monitor.  Call [employer] to inform them you will be off monitor a couple of days.  If unable to tolerate monitor, return monitor to [employer] for early termination.

## 2017-02-22 ENCOUNTER — Ambulatory Visit (INDEPENDENT_AMBULATORY_CARE_PROVIDER_SITE_OTHER): Payer: Medicare Other | Admitting: Internal Medicine

## 2017-02-22 ENCOUNTER — Encounter: Payer: Self-pay | Admitting: Internal Medicine

## 2017-02-22 VITALS — BP 144/82 | HR 59 | Ht 65.0 in | Wt 178.8 lb

## 2017-02-22 DIAGNOSIS — I480 Paroxysmal atrial fibrillation: Secondary | ICD-10-CM | POA: Diagnosis not present

## 2017-02-22 DIAGNOSIS — R55 Syncope and collapse: Secondary | ICD-10-CM | POA: Diagnosis not present

## 2017-02-22 DIAGNOSIS — I10 Essential (primary) hypertension: Secondary | ICD-10-CM

## 2017-02-22 DIAGNOSIS — E876 Hypokalemia: Secondary | ICD-10-CM

## 2017-02-22 LAB — BASIC METABOLIC PANEL
BUN / CREAT RATIO: 22 (ref 12–28)
BUN: 17 mg/dL (ref 8–27)
CHLORIDE: 104 mmol/L (ref 96–106)
CO2: 28 mmol/L (ref 20–29)
Calcium: 9.5 mg/dL (ref 8.7–10.3)
Creatinine, Ser: 0.76 mg/dL (ref 0.57–1.00)
GFR, EST AFRICAN AMERICAN: 95 mL/min/{1.73_m2} (ref >59)
GFR, EST NON AFRICAN AMERICAN: 83 mL/min/{1.73_m2} (ref >59)
Glucose: 112 mg/dL — ABNORMAL HIGH (ref 65–99)
POTASSIUM: 4.6 mmol/L (ref 3.5–5.2)
Sodium: 140 mmol/L (ref 134–144)

## 2017-02-22 LAB — MAGNESIUM: Magnesium: 2.1 mg/dL (ref 1.6–2.3)

## 2017-02-22 NOTE — Progress Notes (Signed)
   PCP: Christain Sacramento, MD   Denise Ayala is a 66 y.o. female who presents today for routine electrophysiology followup.  Since last being seen in our clinic, the patient reports doing very well.  She has had no further syncope.  Today, she denies symptoms of palpitations, chest pain, shortness of breath,  lower extremity edema, dizziness, presyncope, or syncope.  The patient is otherwise without complaint today.   Past Medical History:  Diagnosis Date  . Arthritis   . Breast adenoma 2016   left  . DJD (degenerative joint disease)   . Hypertension   . Lichen planus   . Paroxysmal atrial fibrillation (HCC)    single episode years ago  . Premature atrial contraction   . Syncope 12/27/2016   vasovagal by history   Past Surgical History:  Procedure Laterality Date  . ABDOMINAL HYSTERECTOMY    . BREAST BIOPSY Left 06/24/2015   Procedure: LEFT NIPPLE EXCISION;  Surgeon: [name], MD;  Location: [facility];  Service: General;  Laterality: Left;  . BREAST SURGERY Right    biopsy  . CHOLECYSTECTOMY    . FOOT FUSION    . REPLACEMENT TOTAL KNEE Right 2012, 2010    ROS- all systems are reviewed and negatives except as per HPI above  Current Outpatient Prescriptions  Medication Sig Dispense Refill  . cephALEXin (KEFLEX) 500 MG capsule Take 500 mg by mouth every 12 (twelve) hours.    . diclofenac (VOLTAREN) 50 MG EC tablet Take 50 mg by mouth 2 (two) times daily.    Marland Kitchen losartan (COZAAR) 50 MG tablet Take 50 mg by mouth daily.    . metoprolol succinate (TOPROL-XL) 100 MG 24 hr tablet Take 100 mg by mouth daily. Take with or immediately following a meal.     No current facility-administered medications for this visit.     Physical Exam: Vitals:   02/22/17 0843  BP: (!) 144/82  Pulse: (!) 59  SpO2: 98%  Weight: 178 lb 12.8 oz (81.1 kg)  Height: 5\' 5"  (1.651 m)    GEN- The patient is well appearing, alert and oriented x 3 today.   Head- normocephalic,  atraumatic Eyes-  Sclera clear, conjunctiva pink Ears- hearing intact Oropharynx- clear Lungs- Clear to ausculation bilaterally, normal work of breathing Heart- Regular rate and rhythm, no murmurs, rubs or gallops, PMI not laterally displaced GI- soft, NT, ND, + BS Extremities- no clubbing, cyanosis, or edema  EKG tracing ordered today is personally reviewed and shows sinus rhythm 59 bpm, PACs otherwise normal ekg  Echo and 30 day event monitor were reviewed with the patient today  Assessment and Plan:  1. Syncope Resolved Likely due to vasovagal event in the setting of leg cramps with possible dehydration and low K (secondary to hctz). Echo and 30 day monitor were unrevealing  2. HTN bmet today 2 gram sodium restriction Avoid NSAIDs if able  3. Low K Bmet, Mg today now that she is off of hctz  4.  H/o afib Well controlled (only a single episode previously in 2008) No sustained afib seen on 30 day monitor Avoid anticoagulation at this time  Follow-up with PCP for BP management I will see as needed going forward  Thompson Grayer MD, [facility] 02/22/2017 8:57 AM

## 2017-02-22 NOTE — Patient Instructions (Signed)
Medication Instructions:  Your physician recommends that you continue on your current medications as directed. Please refer to the Current Medication list given to you today.   Labwork: Your physician recommends that you have lab work today: BMP/MAG   Testing/Procedures: None ordered   Follow-Up: Your physician recommends that you schedule a follow-up appointment as needed   Any Other Special Instructions Will Be Listed Below (If Applicable).     If you need a refill on your cardiac medications before your next appointment, please call your pharmacy.

## 2018-01-11 ENCOUNTER — Other Ambulatory Visit: Payer: Self-pay | Admitting: Dermatology

## 2018-01-27 IMAGING — CT CT HEAD W/O CM
3 series · 14 of 47 positions shown, 16 images · non-contrast
Comparison: None.

CLINICAL DATA: Syncopal episode, found on kitchen floor. Nausea.
History of atrial fibrillation.

EXAM:
CT HEAD WITHOUT CONTRAST
TECHNIQUE: Contiguous axial images were obtained from the base of the skull
through the vertex without intravenous contrast.

[Series 3: head 5.0 h30s · axial · 0.41mm/px · z∈[-122,+3]mm · 8 of 31 slices shown, 10 images]
[im 3/31  brain]
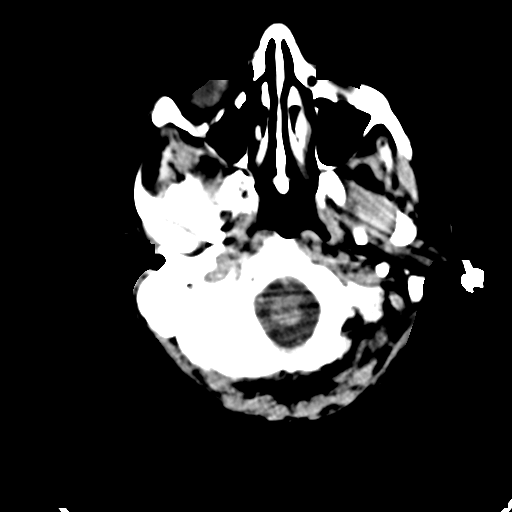
[im 3/31  bone]
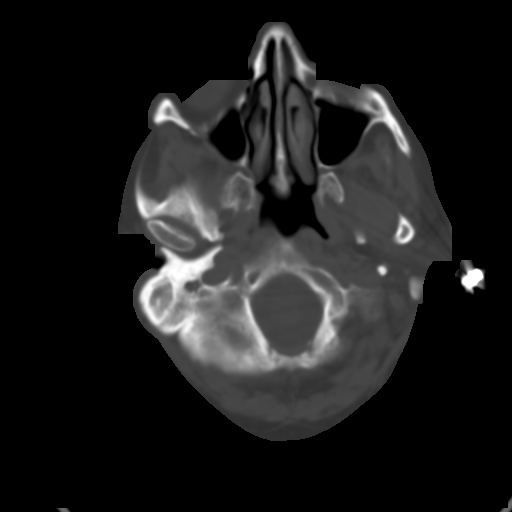
[im 7/31  brain]
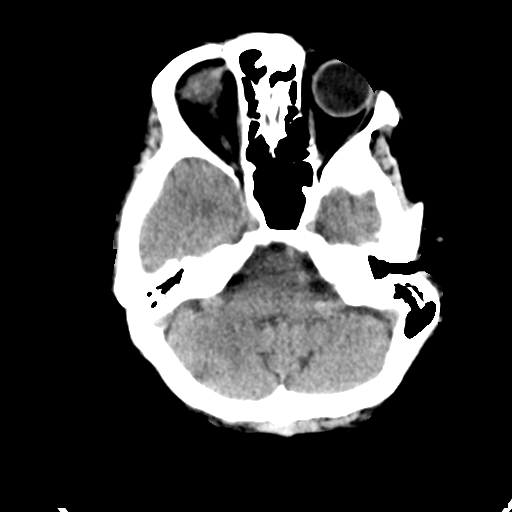
[im 10/31  brain]
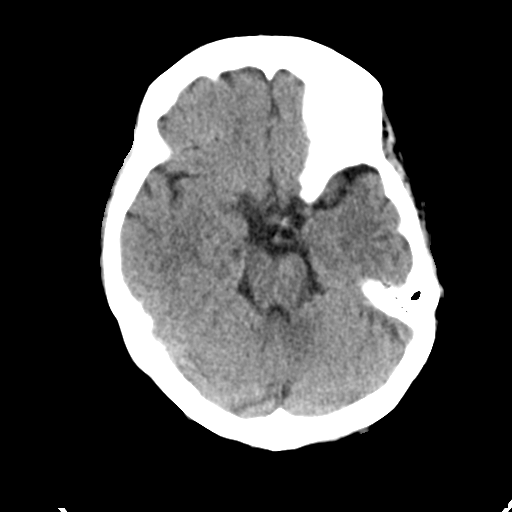
[im 14/31  brain]
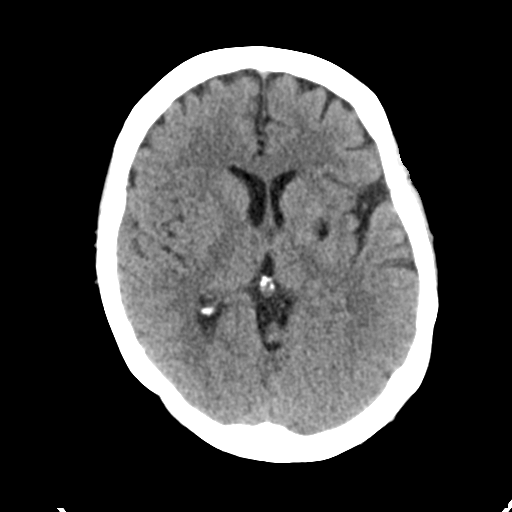
[im 17/31  brain]
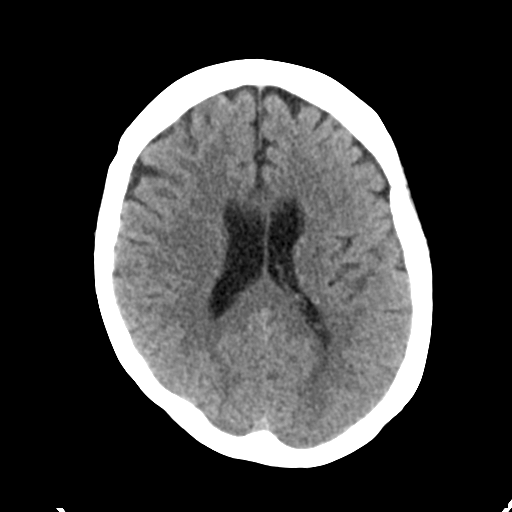
[im 17/31  bone]
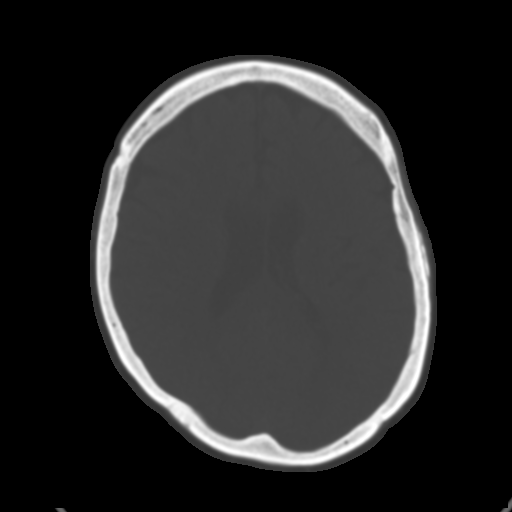
[im 21/31  brain]
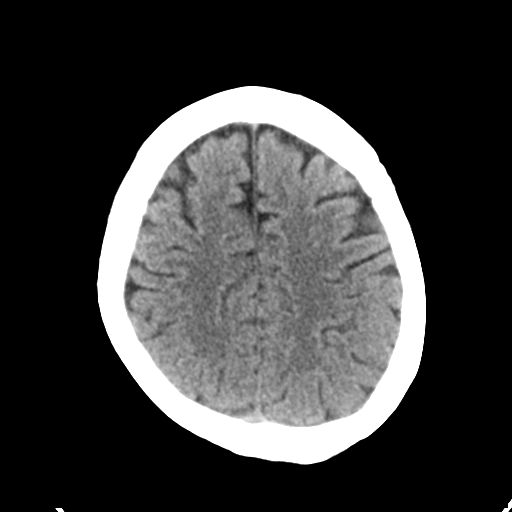
[im 24/31  brain]
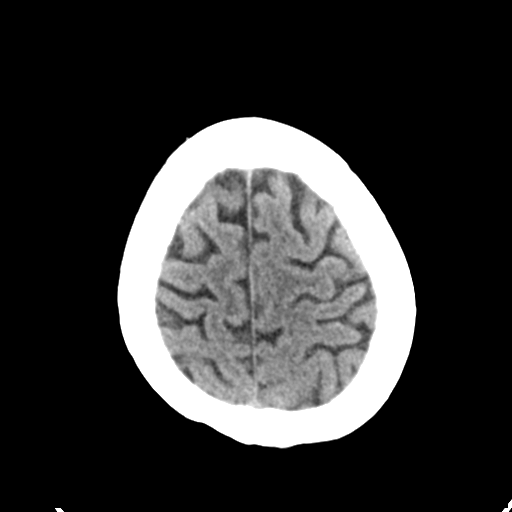
[im 28/31  brain]
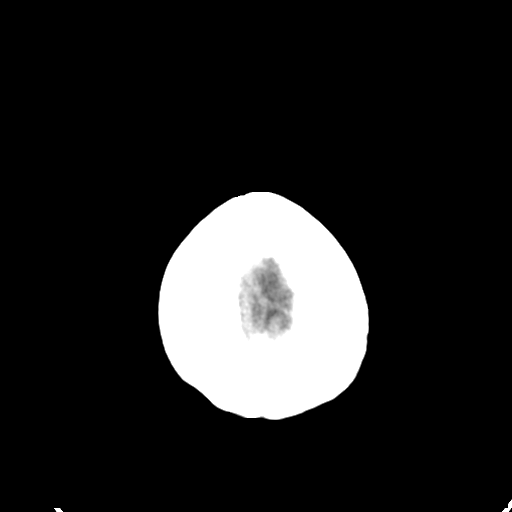

[Series 5: head 3.0 mpr cor · coronal · 0.30mm/px · 3 of 68 slices shown]
[im 23/68  brain]
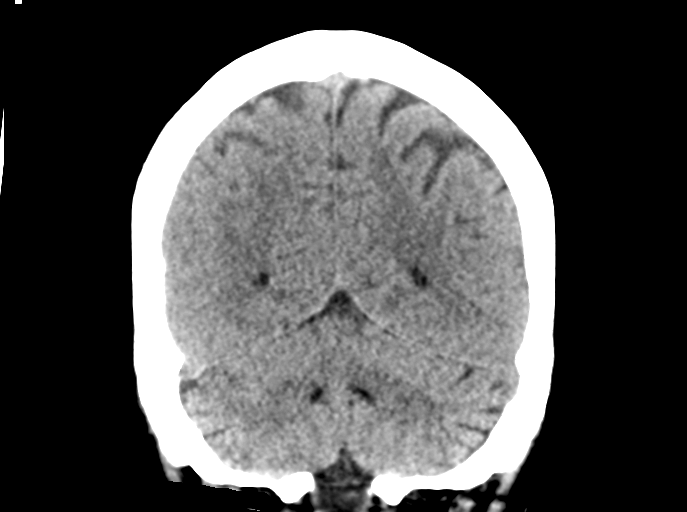
[im 30/68  brain]
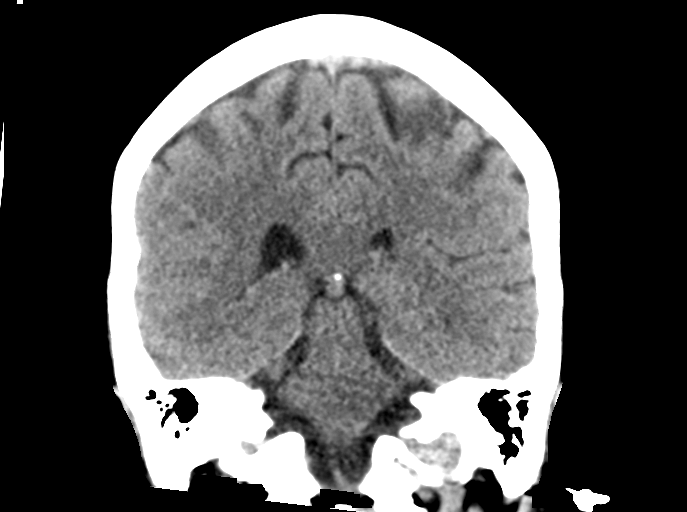
[im 38/68  brain]
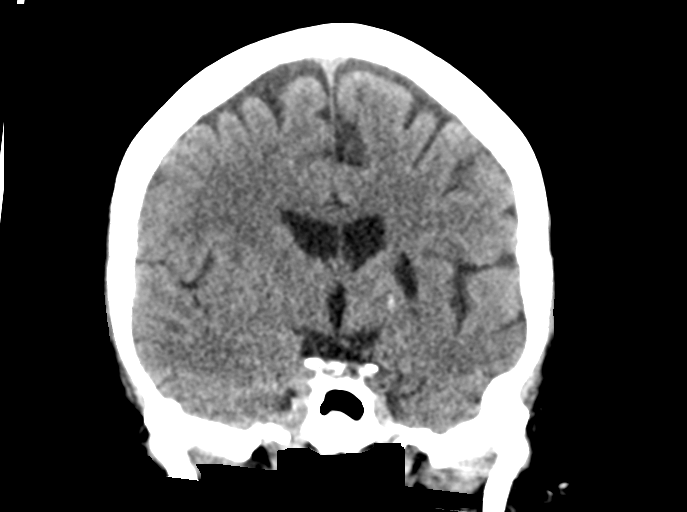

[Series 6: head 3.0 mpr sag · sagittal · 0.30mm/px · 3 of 54 slices shown]
[im 21/54  brain]
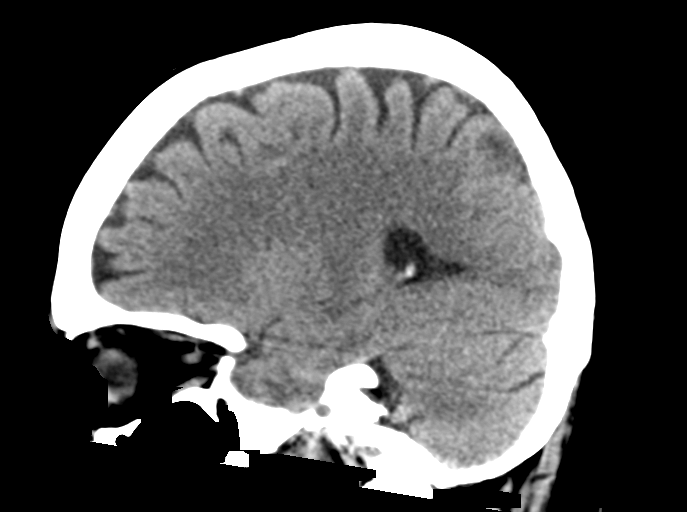
[im 27/54  brain]
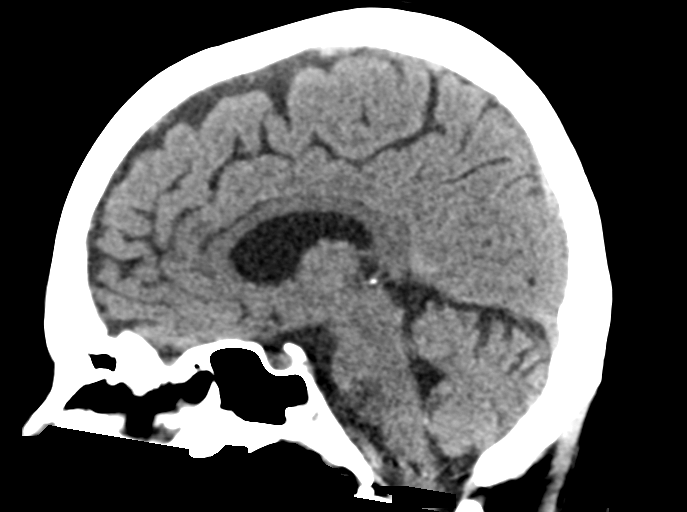
[im 33/54  brain]
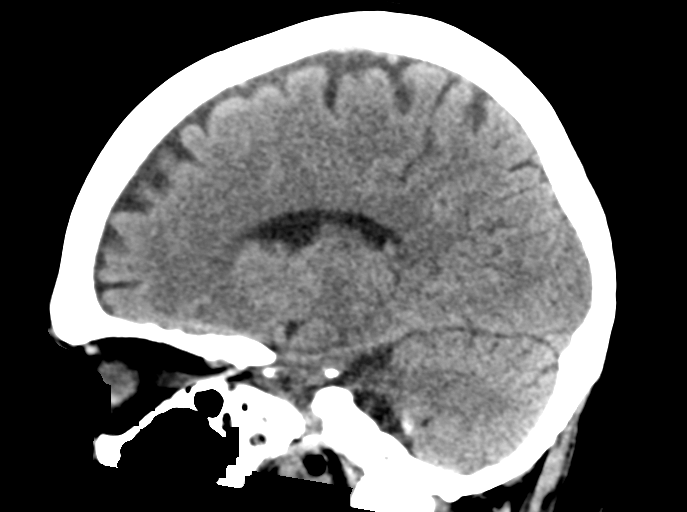

[14 of 47 positions shown; findings below may reference images not displayed]

FINDINGS: BRAIN: No intraparenchymal hemorrhage, mass effect nor midline
shift. Old LEFT basal ganglia cystic infarct with ex vacuo
dilatation LEFT lateral ventricle, no hydrocephalus. No acute large
vascular territory infarcts. No abnormal extra-axial fluid
collections. Basal cisterns are patent.

VASCULAR: Mild calcific atherosclerosis of the carotid siphons.

SKULL: No skull fracture. No significant scalp soft tissue swelling.

SINUSES/ORBITS: Partially calcified sphenoid mucosal retention
cysts, trace paranasal sinus mucosal thickening. Mastoid air cells
are well aerated.The included ocular globes and orbital contents are
non-suspicious.

OTHER: None.
IMPRESSION: No acute intracranial process.

Old LEFT basal ganglia infarct, otherwise negative CT HEAD for age.

## 2018-06-14 ENCOUNTER — Other Ambulatory Visit: Payer: Self-pay | Admitting: Dermatology

## 2018-07-18 ENCOUNTER — Other Ambulatory Visit: Payer: Self-pay | Admitting: Dermatology

## 2018-10-13 ENCOUNTER — Ambulatory Visit (INDEPENDENT_AMBULATORY_CARE_PROVIDER_SITE_OTHER): Payer: Medicare Other | Admitting: Orthopaedic Surgery

## 2018-10-13 ENCOUNTER — Encounter (INDEPENDENT_AMBULATORY_CARE_PROVIDER_SITE_OTHER): Payer: Self-pay | Admitting: Orthopaedic Surgery

## 2018-10-13 ENCOUNTER — Ambulatory Visit (INDEPENDENT_AMBULATORY_CARE_PROVIDER_SITE_OTHER): Payer: Self-pay

## 2018-10-13 ENCOUNTER — Other Ambulatory Visit: Payer: Self-pay

## 2018-10-13 DIAGNOSIS — M19072 Primary osteoarthritis, left ankle and foot: Secondary | ICD-10-CM

## 2018-10-13 DIAGNOSIS — M25572 Pain in left ankle and joints of left foot: Secondary | ICD-10-CM

## 2018-10-13 MED ORDER — LIDOCAINE HCL 1 % IJ SOLN
1.0000 mL | INTRAMUSCULAR | Status: AC | PRN
  Administered 2018-10-13: 1 mL

## 2018-10-13 MED ORDER — METHYLPREDNISOLONE ACETATE 40 MG/ML IJ SUSP
40.0000 mg | INTRAMUSCULAR | Status: AC | PRN
  Administered 2018-10-13: 40 mg via INTRA_ARTICULAR

## 2018-10-13 NOTE — Progress Notes (Signed)
Office Visit Note   Patient: Denise Ayala           Date of Birth: 1951-01-14           MRN: [id number] Visit Date: 10/13/2018              Requested by: Christain Sacramento, MD [address] [place], [place] [address] PCP: Christain Sacramento, MD   Assessment & Plan: Visit Diagnoses:  1. Pain in left ankle and joints of left foot     Plan: Degenerative arthritis talonavicular joint left foot.  Long discussion regarding diagnosis and treatment options.  Patient has boot at home and Voltaren.  Will inject the area of pain with cortisone and monitor response.  Initial response was excellent  Follow-Up Instructions: Return if symptoms worsen or fail to improve.   Orders:  Orders Placed This Encounter  Procedures  . XR Ankle Complete Left   No orders of the defined types were placed in this encounter.     Procedures: Medium Joint Inj on 10/13/2018 10:27 AM Details: 27 G needle, dorsal approach Medications: 1 mL lidocaine 1 %; 40 mg methylPREDNISolone acetate 40 MG/ML  Injected left talo-navicular joint      Clinical Data: No additional findings.   Subjective: No chief complaint on file. Denise Ayala is 68 years old accompanied by her husband and notes that she has had a 3-week history of left foot pain.  Her past history is significant that she underwent a subtalar fusion by Dr. Percell Miller "many years ago".  She had done well up until about 3 weeks ago when she had insidious onset of pain.  Pain is localized on the dorsum of her left foot.  Might have a little swelling.  No injury or trauma.  Has been taking Voltaren.  Has a history of lichen planus treated by the dermatologist.  She is on gabapentin.  HPI  Review of Systems   Objective: Vital Signs: There were no vitals taken for this visit.  Physical Exam Constitutional:      Appearance: She is well-developed.  Eyes:     Pupils: Pupils are equal, round, and reactive to light.  Pulmonary:     Effort: Pulmonary effort is  normal.  Skin:    General: Skin is warm and dry.  Neurological:     Mental Status: She is alert and oriented to person, place, and time.  Psychiatric:        Behavior: Behavior normal.     Ortho Exam awake alert and oriented x3.  Comfortable sitting.  Had slight limp referable to her left foot.  Tenderness along the dorsum of the midfoot the area of the talonavicular joint.  Minimal subtalar motion referable to the prior surgery.  Has skin changes of the tibia to the ankle but none distally. she relates a history of lichen planus.  +1 pulses.  Motor exam intact.  Specialty Comments:  No specialty comments available.  Imaging: Xr Ankle Complete Left  Result Date: 10/13/2018 Films of the left foot were obtained in several projections.  There is a single transfixion screw across the subtalar joint with what appears to be an excellent fusion of that same joint changes at the talonavicular joint with narrowing or spur formation.  There are areas of ectopic calcification within the nonsymptomatic.  No acute changes    PMFS History: There are no active problems to display for this patient.  Past Medical History:  Diagnosis Date  .  Arthritis   . Breast adenoma 2016   left  . DJD (degenerative joint disease)   . Hypertension   . Lichen planus   . Paroxysmal atrial fibrillation (HCC)    single episode years ago  . Premature atrial contraction   . Syncope 12/27/2016   vasovagal by history    History reviewed. No pertinent family history.  Past Surgical History:  Procedure Laterality Date  . ABDOMINAL HYSTERECTOMY    . BREAST BIOPSY Left 06/24/2015   Procedure: LEFT NIPPLE EXCISION;  Surgeon: [name], MD;  Location: [facility];  Service: General;  Laterality: Left;  . BREAST SURGERY Right    biopsy  . CHOLECYSTECTOMY    . FOOT FUSION    . REPLACEMENT TOTAL KNEE Right 2012, 2010   Social History   Occupational History  . Not on file  Tobacco Use  .  Smoking status: Former Smoker    Last attempt to quit: 08/03/1990    Years since quitting: 28.2  . Smokeless tobacco: Never Used  Substance and Sexual Activity  . Alcohol use: Yes    Comment: occas wine  . Drug use: No  . Sexual activity: Yes    Birth control/protection: Surgical     Denise Balding, MD   Note - This record has been created using [employer].  Chart creation errors have been sought, but may not always  have been located. Such creation errors do not reflect on  the standard of medical care.

## 2019-08-24 ENCOUNTER — Ambulatory Visit: Payer: Medicare Other | Attending: Internal Medicine

## 2019-08-24 DIAGNOSIS — Z23 Encounter for immunization: Secondary | ICD-10-CM | POA: Insufficient documentation

## 2019-08-24 NOTE — Progress Notes (Signed)
   Covid-19 Vaccination Clinic  Name:  Denise Ayala    MRN: [id number] DOB: 1951/01/23  08/24/2019  [name] was observed post Covid-19 immunization for 15 minutes without incidence. She was provided with Vaccine Information Sheet and instruction to access the V-Safe system.   [name] was instructed to call 911 with any severe reactions post vaccine: Marland Kitchen Difficulty breathing  . Swelling of your face and throat  . A fast heartbeat  . A bad rash all over your body  . Dizziness and weakness    Immunizations Administered    Name Date Dose VIS Date Route   Pfizer COVID-19 Vaccine 08/24/2019  3:30 PM 0.3 mL 07/14/2019 Intramuscular   Manufacturer: [place]   Lot: [id number]   [place]: [id number]

## 2019-09-14 ENCOUNTER — Ambulatory Visit: Payer: Medicare Other | Attending: Internal Medicine

## 2019-09-14 DIAGNOSIS — Z23 Encounter for immunization: Secondary | ICD-10-CM

## 2019-09-14 NOTE — Progress Notes (Signed)
   Covid-19 Vaccination Clinic  Name:  Denise Ayala    MRN: [id number] DOB: Dec 24, 1950  09/14/2019  [name] was observed post Covid-19 immunization for 15 minutes without incidence. She was provided with Vaccine Information Sheet and instruction to access the V-Safe system.   [name] was instructed to call 911 with any severe reactions post vaccine: Marland Kitchen Difficulty breathing  . Swelling of your face and throat  . A fast heartbeat  . A bad rash all over your body  . Dizziness and weakness    Immunizations Administered    Name Date Dose VIS Date Route   Pfizer COVID-19 Vaccine 09/14/2019  4:37 PM 0.3 mL 07/14/2019 Intramuscular   Manufacturer: [place]   Lot: [id number]   [place]: [id number]

## 2019-11-23 ENCOUNTER — Ambulatory Visit: Payer: Self-pay

## 2019-11-23 ENCOUNTER — Encounter: Payer: Self-pay | Admitting: Orthopaedic Surgery

## 2019-11-23 ENCOUNTER — Ambulatory Visit: Payer: Medicare Other | Admitting: Orthopaedic Surgery

## 2019-11-23 ENCOUNTER — Other Ambulatory Visit: Payer: Self-pay

## 2019-11-23 VITALS — Ht 65.0 in | Wt 180.0 lb

## 2019-11-23 DIAGNOSIS — M79672 Pain in left foot: Secondary | ICD-10-CM | POA: Diagnosis not present

## 2019-11-23 DIAGNOSIS — M19072 Primary osteoarthritis, left ankle and foot: Secondary | ICD-10-CM | POA: Insufficient documentation

## 2019-11-23 DIAGNOSIS — M25562 Pain in left knee: Secondary | ICD-10-CM | POA: Diagnosis not present

## 2019-11-23 MED ORDER — BUPIVACAINE HCL 0.25 % IJ SOLN
2.0000 mL | INTRAMUSCULAR | Status: AC | PRN
  Administered 2019-11-23: 2 mL via INTRA_ARTICULAR

## 2019-11-23 MED ORDER — METHYLPREDNISOLONE ACETATE 40 MG/ML IJ SUSP
80.0000 mg | INTRAMUSCULAR | Status: AC | PRN
  Administered 2019-11-23: 80 mg via INTRA_ARTICULAR

## 2019-11-23 MED ORDER — LIDOCAINE HCL 1 % IJ SOLN
2.0000 mL | INTRAMUSCULAR | Status: AC | PRN
  Administered 2019-11-23: 2 mL

## 2019-11-23 MED ORDER — METHYLPREDNISOLONE ACETATE 40 MG/ML IJ SUSP
30.0000 mg | INTRAMUSCULAR | Status: AC | PRN
  Administered 2019-11-23: 30 mg via INTRA_ARTICULAR

## 2019-11-23 MED ORDER — LIDOCAINE HCL 1 % IJ SOLN
1.0000 mL | INTRAMUSCULAR | Status: AC | PRN
  Administered 2019-11-23: 1 mL

## 2019-11-23 MED ORDER — BUPIVACAINE HCL 0.25 % IJ SOLN
0.6600 mL | INTRAMUSCULAR | Status: AC | PRN
  Administered 2019-11-23: .66 mL via INTRA_ARTICULAR

## 2019-11-23 NOTE — Progress Notes (Signed)
Office Visit Note   Patient: Denise Ayala           Date of Birth: 04-Dec-1950           MRN: [id number] Visit Date: 11/23/2019              Requested by: Christain Sacramento, MD [place],  [place] [address] PCP: Christain Sacramento, MD   Assessment & Plan: Visit Diagnoses:  1. Pain in left foot   2. Acute pain of left knee   3. Primary osteoarthritis, left ankle and foot     Plan: #1: Corticosteroid injection to the left knee was accomplished atraumatically. #2: Corticosteroid injection to the left subtalar was accomplished atraumatically #3: Follow back up as needed   Follow-Up Instructions: No follow-ups on file.   Orders:  Orders Placed This Encounter  Procedures  . XR Foot Complete Left  . XR KNEE 3 VIEW LEFT   No orders of the defined types were placed in this encounter.     Procedures: Large Joint Inj: L knee on 11/23/2019 4:53 PM Indications: pain and diagnostic evaluation Details: 25 G 1.5 in needle, anteromedial approach  Arthrogram: No  Medications: 2 mL lidocaine 1 %; 80 mg methylPREDNISolone acetate 40 MG/ML; 2 mL bupivacaine 0.25 % Outcome: tolerated well, no immediate complications Procedure, treatment alternatives, risks and benefits explained, specific risks discussed. Consent was given by the patient. Patient was prepped and draped in the usual sterile fashion.   Medium Joint Inj on 11/23/2019 4:56 PM Indications: pain Details: 25 G 1.5 in needle, anterolateral approach Medications: 1 mL lidocaine 1 %; 0.66 mL bupivacaine 0.25 %; 30 mg methylPREDNISolone acetate 40 MG/ML Outcome: tolerated well, no immediate complications  PERFORMED BY DR [facility]  Consent was given by the patient. Immediately prior to procedure a time out was called to verify the correct patient, procedure, equipment, support staff and site/side marked as required. Patient was prepped and draped in the usual sterile fashion.       Clinical Data: No additional  findings.   Subjective: Chief Complaint  Patient presents with  . Left Foot - Pain   HPI Patient presents today for left foot pain. She said that she fell two weeks ago and landed with her foot underneath her. She said that she had surgery with Dr.Murphy many years ago on her left foot and it is is fused. She is concerned that she has messed something up because she has pain in her lateral foot.  She also had injured the left knee at the time of her fall.  She said that the pain spreads into her left knee by the end of the day. She takes Voltaren and Tylenol. She said that her foot and knee seem swollen.     Review of Systems  Constitutional: Negative for fatigue.  HENT: Negative for ear pain.   Eyes: Negative for pain.  Respiratory: Negative for shortness of breath.   Cardiovascular: Negative for leg swelling.  Gastrointestinal: Negative for constipation and diarrhea.  Endocrine: Negative for cold intolerance and heat intolerance.  Genitourinary: Negative for difficulty urinating.  Musculoskeletal: Positive for joint swelling.  Skin: Negative for rash.  Allergic/Immunologic: Negative for food allergies.  Neurological: Negative for weakness.  Hematological: Does not bruise/bleed easily.  Psychiatric/Behavioral: Positive for sleep disturbance.     Objective: Vital Signs: Ht 5\' 5"  (1.651 m)   Wt 180 lb (81.6 kg)   BMI 29.95 kg/m   Physical Exam Constitutional:  Appearance: Normal appearance. She is well-developed.  HENT:     Head: Normocephalic.  Eyes:     Pupils: Pupils are equal, round, and reactive to light.  Pulmonary:     Effort: Pulmonary effort is normal.  Skin:    General: Skin is warm and dry.  Neurological:     Mental Status: She is alert and oriented to person, place, and time.  Psychiatric:        Behavior: Behavior normal.     Ortho Exam  Exam today reveals tenderness to palpation along the lateral aspect of the ankle and foot.  She does have a  subtalar fusion with limited motion at the joint.  She does have tenderness to palpation with inversion eversion of the foot.  She can get to neutral comfortably in the ankle.  The left knee reveals a mild effusion without warmth or erythema.  She does have crepitance with range of motion.    No ecchymosis.  Appears ligamentously stable.  Specialty Comments:  No specialty comments available.  Imaging: No results found.   PMFS History: Current Outpatient Medications  Medication Sig Dispense Refill  . cephALEXin (KEFLEX) 250 MG capsule Take 250 mg by mouth daily.    . diclofenac (VOLTAREN) 50 MG EC tablet Take 50 mg by mouth 2 (two) times daily.    . metoprolol succinate (TOPROL-XL) 100 MG 24 hr tablet Take 100 mg by mouth daily. Take with or immediately following a meal.    . oxybutynin (DITROPAN-XL) 10 MG 24 hr tablet oxybutynin chloride ER 10 mg tablet,extended release 24 hr    . valsartan (DIOVAN) 320 MG tablet valsartan 320 mg tablet    . losartan (COZAAR) 50 MG tablet Take 50 mg by mouth daily.     No current facility-administered medications for this visit.    Patient Active Problem List   Diagnosis Date Noted  . Primary osteoarthritis, left ankle and foot 11/23/2019   Past Medical History:  Diagnosis Date  . Arthritis   . Breast adenoma 2016   left  . DJD (degenerative joint disease)   . Hypertension   . Lichen planus   . Paroxysmal atrial fibrillation (HCC)    single episode years ago  . Premature atrial contraction   . Syncope 12/27/2016   vasovagal by history    No family history on file.  Past Surgical History:  Procedure Laterality Date  . ABDOMINAL HYSTERECTOMY    . BREAST BIOPSY Left 06/24/2015   Procedure: LEFT NIPPLE EXCISION;  Surgeon: [name], MD;  Location: [facility];  Service: General;  Laterality: Left;  . BREAST SURGERY Right    biopsy  . CHOLECYSTECTOMY    . FOOT FUSION    . REPLACEMENT TOTAL KNEE Right 2012, 2010    Social History   Occupational History  . Not on file  Tobacco Use  . Smoking status: Former Smoker    Quit date: 08/03/1990    Years since quitting: 29.3  . Smokeless tobacco: Never Used  Substance and Sexual Activity  . Alcohol use: Yes    Comment: occas wine  . Drug use: No  . Sexual activity: Yes    Birth control/protection: Surgical

## 2019-11-29 ENCOUNTER — Ambulatory Visit: Payer: Medicare Other | Admitting: Orthopaedic Surgery

## 2022-10-26 ENCOUNTER — Ambulatory Visit (HOSPITAL_BASED_OUTPATIENT_CLINIC_OR_DEPARTMENT_OTHER)
Admission: RE | Admit: 2022-10-26 | Discharge: 2022-10-26 | Disposition: A | Discharge status: Home / Self Care | Payer: Medicare Other | Source: Ambulatory Visit | Attending: Family Medicine | Admitting: Family Medicine

## 2022-10-26 ENCOUNTER — Other Ambulatory Visit: Payer: Self-pay

## 2022-10-26 DIAGNOSIS — I824Y2 Acute embolism and thrombosis of unspecified deep veins of left proximal lower extremity: Secondary | ICD-10-CM

## 2023-06-02 ENCOUNTER — Ambulatory Visit: Payer: Medicare Other | Admitting: Cardiovascular Disease
